# Patient Record
Sex: Male | Born: 1950 | Race: White | Hispanic: No | Marital: Married | Smoking: Never smoker
Health system: Southern US, Community
[De-identification: ages and names within clinical notes are randomized; demographics above are authoritative.]

## PROBLEM LIST (undated history)

## (undated) DIAGNOSIS — N200 Calculus of kidney: Secondary | ICD-10-CM

## (undated) DIAGNOSIS — K635 Polyp of colon: Secondary | ICD-10-CM

## (undated) DIAGNOSIS — K589 Irritable bowel syndrome without diarrhea: Secondary | ICD-10-CM

## (undated) HISTORY — DX: Calculus of kidney: N20.0

## (undated) HISTORY — PX: KNEE ARTHROSCOPY: SUR90

## (undated) HISTORY — DX: Irritable bowel syndrome, unspecified: K58.9

## (undated) HISTORY — DX: Polyp of colon: K63.5

---

## 1999-04-05 ENCOUNTER — Emergency Department (HOSPITAL_COMMUNITY): Admission: EM | Admit: 1999-04-05 | Discharge: 1999-04-05 | Payer: Self-pay | Admitting: Emergency Medicine

## 1999-04-05 ENCOUNTER — Encounter: Payer: Self-pay | Admitting: Emergency Medicine

## 2004-11-12 ENCOUNTER — Ambulatory Visit: Payer: Self-pay | Admitting: Internal Medicine

## 2004-11-23 ENCOUNTER — Ambulatory Visit: Payer: Self-pay | Admitting: Internal Medicine

## 2004-11-24 ENCOUNTER — Encounter: Admission: RE | Admit: 2004-11-24 | Discharge: 2004-11-24 | Payer: Self-pay | Admitting: Internal Medicine

## 2005-01-05 ENCOUNTER — Ambulatory Visit: Payer: Self-pay | Admitting: Gastroenterology

## 2005-01-19 ENCOUNTER — Encounter: Payer: Self-pay | Admitting: Internal Medicine

## 2005-01-19 ENCOUNTER — Ambulatory Visit: Payer: Self-pay | Admitting: Gastroenterology

## 2005-10-31 ENCOUNTER — Ambulatory Visit: Payer: Self-pay | Admitting: Internal Medicine

## 2005-12-01 ENCOUNTER — Ambulatory Visit: Payer: Self-pay | Admitting: Internal Medicine

## 2006-01-15 ENCOUNTER — Emergency Department: Payer: Self-pay | Admitting: Emergency Medicine

## 2006-05-21 ENCOUNTER — Emergency Department: Payer: Self-pay | Admitting: Emergency Medicine

## 2006-07-28 ENCOUNTER — Ambulatory Visit: Payer: Self-pay | Admitting: Internal Medicine

## 2007-05-31 DIAGNOSIS — Z860100 Personal history of colon polyps, unspecified: Secondary | ICD-10-CM | POA: Insufficient documentation

## 2007-05-31 DIAGNOSIS — Z87442 Personal history of urinary calculi: Secondary | ICD-10-CM | POA: Insufficient documentation

## 2007-06-22 ENCOUNTER — Ambulatory Visit: Payer: Self-pay | Admitting: Internal Medicine

## 2007-06-22 LAB — CONVERTED CEMR LAB
ALT: 18 units/L (ref 0–53)
AST: 27 units/L (ref 0–37)
Albumin: 4 g/dL (ref 3.5–5.2)
Alkaline Phosphatase: 61 units/L (ref 39–117)
BUN: 19 mg/dL (ref 6–23)
Basophils Absolute: 0 10*3/uL (ref 0.0–0.1)
Basophils Relative: 0.3 % (ref 0.0–1.0)
Bilirubin, Direct: 0.1 mg/dL (ref 0.0–0.3)
Blood in Urine, dipstick: NEGATIVE
CO2: 32 meq/L (ref 19–32)
Calcium: 9.6 mg/dL (ref 8.4–10.5)
Chloride: 111 meq/L (ref 96–112)
Cholesterol: 197 mg/dL (ref 0–200)
Creatinine, Ser: 1.2 mg/dL (ref 0.4–1.5)
Eosinophils Absolute: 0.2 10*3/uL (ref 0.0–0.6)
Eosinophils Relative: 5 % (ref 0.0–5.0)
GFR calc Af Amer: 80 mL/min
GFR calc non Af Amer: 66 mL/min
Glucose, Bld: 98 mg/dL (ref 70–99)
Glucose, Urine, Semiquant: NEGATIVE
HCT: 47.2 % (ref 39.0–52.0)
HDL: 49.8 mg/dL (ref >39.0)
Hemoglobin: 15.6 g/dL (ref 13.0–17.0)
Ketones, urine, test strip: NEGATIVE
LDL Cholesterol: 125 mg/dL — ABNORMAL HIGH (ref 0–99)
Lymphocytes Relative: 38.8 % (ref 12.0–46.0)
MCHC: 33.1 g/dL (ref 30.0–36.0)
MCV: 91.8 fL (ref 78.0–100.0)
Monocytes Absolute: 0.6 10*3/uL (ref 0.2–0.7)
Monocytes Relative: 11.1 % — ABNORMAL HIGH (ref 3.0–11.0)
Neutro Abs: 2.3 10*3/uL (ref 1.4–7.7)
Neutrophils Relative %: 44.8 % (ref 43.0–77.0)
Nitrite: NEGATIVE
PSA: 0.76 ng/mL (ref 0.10–4.00)
Platelets: 217 10*3/uL (ref 150–400)
Potassium: 4.4 meq/L (ref 3.5–5.1)
Protein, U semiquant: NEGATIVE
RBC: 5.14 M/uL (ref 4.22–5.81)
RDW: 12.4 % (ref 11.5–14.6)
Sodium: 150 meq/L — ABNORMAL HIGH (ref 135–145)
Specific Gravity, Urine: >=1.03
TSH: 1.18 microintl units/mL (ref 0.35–5.50)
Total Bilirubin: 1 mg/dL (ref 0.3–1.2)
Total CHOL/HDL Ratio: 4
Total Protein: 7 g/dL (ref 6.0–8.3)
Triglycerides: 111 mg/dL (ref 0–149)
Urobilinogen, UA: 1
VLDL: 22 mg/dL (ref 0–40)
WBC Urine, dipstick: NEGATIVE
WBC: 5 10*3/uL (ref 4.5–10.5)
pH: 5

## 2007-06-29 ENCOUNTER — Ambulatory Visit: Payer: Self-pay | Admitting: Internal Medicine

## 2007-08-01 ENCOUNTER — Ambulatory Visit: Payer: Self-pay | Admitting: Internal Medicine

## 2007-08-01 LAB — CONVERTED CEMR LAB
BUN: 21 mg/dL (ref 6–23)
CO2: 30 meq/L (ref 19–32)
Calcium: 9.8 mg/dL (ref 8.4–10.5)
Chloride: 105 meq/L (ref 96–112)
Creatinine, Ser: 1.1 mg/dL (ref 0.4–1.5)
GFR calc Af Amer: 89 mL/min
GFR calc non Af Amer: 73 mL/min
Glucose, Bld: 88 mg/dL (ref 70–99)
Potassium: 4.7 meq/L (ref 3.5–5.1)
Sodium: 142 meq/L (ref 135–145)

## 2007-08-08 ENCOUNTER — Ambulatory Visit: Payer: Self-pay | Admitting: Internal Medicine

## 2009-04-09 ENCOUNTER — Ambulatory Visit (HOSPITAL_BASED_OUTPATIENT_CLINIC_OR_DEPARTMENT_OTHER): Admission: RE | Admit: 2009-04-09 | Discharge: 2009-04-09 | Payer: Self-pay | Admitting: Orthopedic Surgery

## 2009-12-17 ENCOUNTER — Encounter (INDEPENDENT_AMBULATORY_CARE_PROVIDER_SITE_OTHER): Payer: Self-pay | Admitting: *Deleted

## 2010-04-23 ENCOUNTER — Ambulatory Visit: Payer: Self-pay | Admitting: Internal Medicine

## 2010-04-23 LAB — CONVERTED CEMR LAB
ALT: 13 units/L (ref 0–53)
AST: 21 units/L (ref 0–37)
Albumin: 4 g/dL (ref 3.5–5.2)
Alkaline Phosphatase: 54 units/L (ref 39–117)
BUN: 23 mg/dL (ref 6–23)
Basophils Absolute: 0 10*3/uL (ref 0.0–0.1)
Basophils Relative: 0.7 % (ref 0.0–3.0)
Bilirubin Urine: NEGATIVE
Bilirubin, Direct: 0.1 mg/dL (ref 0.0–0.3)
CO2: 31 meq/L (ref 19–32)
Calcium: 9.4 mg/dL (ref 8.4–10.5)
Chloride: 112 meq/L (ref 96–112)
Cholesterol: 195 mg/dL (ref 0–200)
Creatinine, Ser: 1.1 mg/dL (ref 0.4–1.5)
Eosinophils Absolute: 0.2 10*3/uL (ref 0.0–0.7)
Eosinophils Relative: 4.1 % (ref 0.0–5.0)
GFR calc non Af Amer: 72.41 mL/min (ref >60)
Glucose, Bld: 92 mg/dL (ref 70–99)
HCT: 44.9 % (ref 39.0–52.0)
HDL: 52.4 mg/dL (ref >39.00)
Hemoglobin, Urine: NEGATIVE
Hemoglobin: 15.3 g/dL (ref 13.0–17.0)
Ketones, ur: NEGATIVE mg/dL
LDL Cholesterol: 117 mg/dL — ABNORMAL HIGH (ref 0–99)
Leukocytes, UA: NEGATIVE
Lymphocytes Relative: 35.9 % (ref 12.0–46.0)
Lymphs Abs: 2 10*3/uL (ref 0.7–4.0)
MCHC: 34 g/dL (ref 30.0–36.0)
MCV: 91.8 fL (ref 78.0–100.0)
Monocytes Absolute: 0.7 10*3/uL (ref 0.1–1.0)
Monocytes Relative: 12.3 % — ABNORMAL HIGH (ref 3.0–12.0)
Neutro Abs: 2.6 10*3/uL (ref 1.4–7.7)
Neutrophils Relative %: 47 % (ref 43.0–77.0)
Nitrite: NEGATIVE
PSA: 0.6 ng/mL (ref 0.10–4.00)
Platelets: 192 10*3/uL (ref 150.0–400.0)
Potassium: 4.4 meq/L (ref 3.5–5.1)
RBC: 4.89 M/uL (ref 4.22–5.81)
RDW: 13.6 % (ref 11.5–14.6)
Sodium: 144 meq/L (ref 135–145)
Specific Gravity, Urine: >=1.03 (ref 1.000–1.030)
TSH: 1.1 microintl units/mL (ref 0.35–5.50)
Total Bilirubin: 0.8 mg/dL (ref 0.3–1.2)
Total CHOL/HDL Ratio: 4
Total Protein, Urine: NEGATIVE mg/dL
Total Protein: 6.6 g/dL (ref 6.0–8.3)
Triglycerides: 128 mg/dL (ref 0.0–149.0)
Urine Glucose: NEGATIVE mg/dL
Urobilinogen, UA: 1 (ref 0.0–1.0)
VLDL: 25.6 mg/dL (ref 0.0–40.0)
WBC: 5.6 10*3/uL (ref 4.5–10.5)
pH: 5.5 (ref 5.0–8.0)

## 2010-04-30 ENCOUNTER — Ambulatory Visit: Payer: Self-pay | Admitting: Internal Medicine

## 2010-05-04 ENCOUNTER — Ambulatory Visit: Payer: Self-pay | Admitting: Internal Medicine

## 2010-08-31 ENCOUNTER — Ambulatory Visit: Payer: Self-pay | Admitting: Internal Medicine

## 2010-08-31 DIAGNOSIS — N62 Hypertrophy of breast: Secondary | ICD-10-CM

## 2010-09-15 ENCOUNTER — Encounter
Admission: RE | Admit: 2010-09-15 | Discharge: 2010-09-15 | Payer: Self-pay | Source: Home / Self Care | Admitting: Internal Medicine

## 2010-09-15 LAB — HM MAMMOGRAPHY

## 2010-09-20 ENCOUNTER — Encounter: Payer: Self-pay | Admitting: Internal Medicine

## 2010-11-09 NOTE — Assessment & Plan Note (Signed)
Summary: 4 MTH ROV // RS/pts wife rsc/cjr--- PTS WIFE RSC // RS   Vital Signs:  Patient profile:   60 year old male Weight:      199 pounds Temp:     98.0 degrees F oral Pulse rate:   68 / minute Pulse rhythm:   regular BP sitting:   132 / 84  (left arm) Cuff size:   regular  Vitals Entered By: Alfred Levins, CMA (August 31, 2010 9:53 AM) CC: lt breast is larger than right, lt elbow pain, bumps on both legs   CC:  lt breast is larger than right, lt elbow pain, and bumps on both legs.  History of Present Illness: six-week history of unilateral breast enlargement. He describes painless left breast enlargement. No palpable mass. No unusual medications. No other exacerbating or relieving factors.  Patient denies any chest pain, shortness breath, PND.  Current Medications (verified): 1)  Qc Ibuprofen Ib 200 Mg  Tabs (Ibuprofen) .... Prn 2)  Triamcinolone Acetonide 0.1 % Crea (Triamcinolone Acetonide) .... Apply Bid To Affected Area  Allergies (verified): No Known Drug Allergies  Physical Exam  General:  well-developed well-nourished male in no acute distress. HEENT exam atraumatic, normocephalic. Neck supple. Chest clear auscultation. Patient does have significantly larger left breast than right. No masses identified. No axillary adenopathy. Abdominal exam active bowel sounds, soft.   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 Needs further evaluation. We'll check mammogram. I'll call patient after get the results. Orders: Radiology Referral (Radiology)  Complete Medication List: 1)  Qc Ibuprofen Ib 200 Mg Tabs (Ibuprofen) .... Prn 2)  Triamcinolone Acetonide 0.1 % Crea (Triamcinolone acetonide) .... Apply bid to affected area   Orders Added: 1)  Radiology Referral [Radiology] 2)  Est. Patient Level III [88416]

## 2010-11-09 NOTE — Letter (Signed)
Summary: Colonoscopy Date Change Letter  [facility]  [address] [place], [place] [address]   Phone: [phone]  Fax: [phone]      December 17, 2009 MRN: [id number]   Jeremy Kerr [address] [place], [place]  [address]   Dear [name],   Previously you were recommended to have a repeat colonoscopy around this time. Your chart was recently reviewed by Dr. Jarold Motto of [facility]. Follow up colonoscopy is now recommended in April 2016. This revised recommendation is based on current, nationally recognized guidelines for colorectal cancer screening and polyp surveillance. These guidelines are endorsed by the American Cancer Society, [employer] on Colorectal Cancer as well as numerous other major medical organizations.  Please understand that our recommendation assumes that you do not have any new symptoms such as bleeding, a change in bowel habits, anemia, or significant abdominal discomfort. If you do have any concerning GI symptoms or want to discuss the guideline recommendations, please call to arrange an office visit at your earliest convenience. Otherwise we will keep you in our reminder system and contact you 1-2 months prior to the date listed above to schedule your next colonoscopy.  Thank you,   Vania Rea. Jarold Motto, M.D.  [facility] [phone]

## 2010-11-09 NOTE — Assessment & Plan Note (Signed)
Summary: cpx//ccm   Vital Signs:  Patient profile:   60 year old male Height:      72 inches Weight:      199 pounds BMI:     27.09 Pulse rate:   62 / minute Pulse rhythm:   regular Resp:     12 per minute BP sitting:   120 / 76  (left arm) Cuff size:   regular  Vitals Entered By: Gladis Riffle, RN (April 30, 2010 9:11 AM) CC: cpx, labs done--thinks colonoscopy due this year--c/o left knee pain, states ACL tear was not repaired Is Patient Diabetic? No   CC:  cpx, labs done--thinks colonoscopy due this year--c/o left knee pain, and states ACL tear was not repaired.  History of Present Illness: CPX  Preventive Screening-Counseling & Management  Alcohol-Tobacco     Smoking Status: never  Current Problems (verified): 1)  Physical Examination  (ICD-V70.0) 2)  Nephrolithiasis, Hx of  (ICD-V13.01) 3)  Colonic Polyps, Hx of  (ICD-V12.72)  Current Medications (verified): 1)  Qc Ibuprofen Ib 200 Mg  Tabs (Ibuprofen) .... 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   Head:  normocephalic and atraumatic.   Eyes:  pupils equal and pupils round.   Ears:  R ear normal and L ear normal.   Nose:  no external deformity and no external erythema.   Neck:  No deformities, masses, or tenderness noted. Chest Wall:  No deformities, masses, tenderness or gynecomastia  noted. Lungs:  normal respiratory effort and no intercostal retractions.   Heart:  normal rate and regular rhythm.   Abdomen:  Bowel sounds positive,abdomen soft and non-tender without masses, organomegaly or hernias noted. Prostate:  no nodules and no asymmetry.   Msk:  No deformity or scoliosis noted of thoracic or lumbar spine.   Pulses:  R and L carotid,radial,femoral,dorsalis pedis and posterior tibial pulses are full and equal bilaterally Neurologic:  cranial nerves II-XII intact and gait normal.     Impression & Recommendations:  Problem # 1:  PHYSICAL EXAMINATION (ICD-V70.0) health maint UTD  reviewed colonoscopy letter  Problem # 2:  KNEE PAIN (ZOX-096.04) Assessment: New left knee pain gradually worsening throbbing discomfort exam is unremarkable will check xray His updated medication list for this problem includes:    Qc Ibuprofen Ib 200 Mg Tabs (Ibuprofen) .Marland Kitchen... Prn  Complete Medication List: 1)  Qc Ibuprofen Ib 200 Mg Tabs (Ibuprofen) .... Prn  Appended Document: Orders Update     Clinical Lists Changes  Orders: Added new Test order of T-Knee Left 2 view ([id number]) - Signed

## 2010-11-11 NOTE — Letter (Signed)
Summary: Generic Letter  [facility]  [address] [place], [place] [address]   Phone: [phone]  Fax: [phone]    09/20/2010  Jeremy Kerr [address] [place], [place]  [address]  Dear Jeremy Kerr,  I have been trying to reach you.  Please call me at your earliest convenience to discuss some test results and update your phone number when you call.  [phone] ext [phone]    Sincerely,  Alfred Levins, CMA

## 2011-01-16 LAB — POCT HEMOGLOBIN-HEMACUE: Hemoglobin: 15.5 g/dL (ref 13.0–17.0)

## 2011-02-22 NOTE — Op Note (Signed)
Jeremy Kerr, STARACE NO.:  [id number]   MEDICAL RECORD NO.:  [id number]          PATIENT TYPE:  AMB   LOCATION:  DSC                          FACILITY:  [facility]   PHYSICIAN:  Loreta Ave, M.D. DATE OF BIRTH:  1951-09-05   DATE OF PROCEDURE:  04/09/2009  DATE OF DISCHARGE:                               OPERATIVE REPORT   PREOPERATIVE DIAGNOSES:  Left knee medial meniscus tear, anterior  cruciate ligament tear.   POSTOPERATIVE DIAGNOSES:  Left knee medial meniscus tear, anterior  cruciate ligament tear with some focal grade 3 changes, lateral patella.   PROCEDURES:  Left knee exam under anesthesia; arthroscopy; debridement  of medial meniscus; chondroplasty, patella; and debridement of anterior  cruciate ligament with removal of bone and loose body of near proximal  aspect.   SURGEON:  Loreta Ave, MD   ASSISTANT:  Genene Churn. Barry Dienes, PA   ANESTHESIA:  General.   ESTIMATED BLOOD LOSS:  Minimal.   SPECIMEN:  None.   CULTURES:  None.   COMPLICATIONS:  None.   DRESSING:  Sterile compressive.   PROCEDURE:  The patient brought to the operating room and placed on the  operating table in supine position.  After adequate anesthesia had been  obtained, knee examined.  Positive Lachman, positive drawer, positive  pivot shift.  Leg holder applied.  Leg prepped and draped in the usual  sterile fashion.  Three portals created, one superolateral, one each  medial and lateral parapatellar.  Inflow catheter introduced and the  standard arthroscope introduced and knee inspected.  Some focal grade 3  changes, lateral patella and debrided with chondroplasty.  Good  tracking.  Remaining articular cartilage throughout looked good.  Lateral meniscus, lateral compartment normal.  Medial meniscus what was  left to the posterior half had marked complex tearing with detachment of  the posterior horn.  Posterior half removed, tapered into remaining  meniscus.  As I  discussed with the patient preop and by his wishes, I  just debrided out the ACL tear which was completely avulsed proximal and  little bony fragment but then tearing throughout the ligament.  All  displaced tissue and loose body removed.  The entire knee examined and  no other findings appreciated.  Although he had significant instability,  he is going to try rehab before reconstruction.  Instruments and fluid removed.  Portals in knee were injected with  Marcaine.  Portals closed with 4-0 nylon.  Sterile compressive dressing  applied.  Anesthesia reversed.  Brought to the recovery room.  Tolerated  the surgery well.  No complications.      Loreta Ave, M.D.  Electronically Signed     DFM/MEDQ  D:  04/09/2009  T:  04/10/2009  Job:  [id number]

## 2011-05-09 ENCOUNTER — Encounter: Payer: Self-pay | Admitting: Internal Medicine

## 2011-05-10 ENCOUNTER — Ambulatory Visit (INDEPENDENT_AMBULATORY_CARE_PROVIDER_SITE_OTHER): Payer: BC Managed Care – PPO | Admitting: Internal Medicine

## 2011-05-10 ENCOUNTER — Encounter: Payer: Self-pay | Admitting: Internal Medicine

## 2011-05-10 VITALS — BP 130/84 | Temp 97.7°F | Ht 72.0 in | Wt 202.0 lb

## 2011-05-10 DIAGNOSIS — R519 Headache, unspecified: Secondary | ICD-10-CM | POA: Insufficient documentation

## 2011-05-10 DIAGNOSIS — Z0000 Encounter for general adult medical examination without abnormal findings: Secondary | ICD-10-CM

## 2011-05-10 DIAGNOSIS — R51 Headache: Secondary | ICD-10-CM

## 2011-05-10 LAB — LIPID PANEL
HDL: 55.9 mg/dL (ref >39.00)
Total CHOL/HDL Ratio: 4
Triglycerides: 143 mg/dL (ref 0.0–149.0)
VLDL: 28.6 mg/dL (ref 0.0–40.0)

## 2011-05-10 LAB — PSA: PSA: 0.65 ng/mL (ref 0.10–4.00)

## 2011-05-10 LAB — CBC WITH DIFFERENTIAL/PLATELET
Basophils Relative: 0.7 % (ref 0.0–3.0)
Eosinophils Relative: 7.1 % — ABNORMAL HIGH (ref 0.0–5.0)
Hemoglobin: 15 g/dL (ref 13.0–17.0)
Lymphocytes Relative: 31.5 % (ref 12.0–46.0)
Monocytes Relative: 12.4 % — ABNORMAL HIGH (ref 3.0–12.0)
Neutro Abs: 2.5 10*3/uL (ref 1.4–7.7)
Neutrophils Relative %: 48.3 % (ref 43.0–77.0)
RBC: 5.03 Mil/uL (ref 4.22–5.81)
WBC: 5.3 10*3/uL (ref 4.5–10.5)

## 2011-05-10 LAB — POCT URINALYSIS DIPSTICK
Bilirubin, UA: NEGATIVE
Blood, UA: NEGATIVE
Glucose, UA: NEGATIVE
Leukocytes, UA: NEGATIVE
Nitrite, UA: NEGATIVE
Urobilinogen, UA: 2

## 2011-05-10 LAB — BASIC METABOLIC PANEL
CO2: 29 mEq/L (ref 19–32)
Calcium: 9.6 mg/dL (ref 8.4–10.5)
Creatinine, Ser: 1.1 mg/dL (ref 0.4–1.5)
Sodium: 145 mEq/L (ref 135–145)

## 2011-05-10 LAB — HEPATIC FUNCTION PANEL
ALT: 18 U/L (ref 0–53)
Bilirubin, Direct: 0.2 mg/dL (ref 0.0–0.3)
Total Bilirubin: 0.7 mg/dL (ref 0.3–1.2)

## 2011-05-10 LAB — TSH: TSH: 1.59 u[IU]/mL (ref 0.35–5.50)

## 2011-05-10 MED ORDER — MELOXICAM 7.5 MG PO TABS: 7.5000 mg | ORAL_TABLET | Freq: Every day | ORAL | Status: DC

## 2011-05-10 NOTE — Progress Notes (Signed)
  Subjective:    Patient ID: Jeremy Kerr, male    DOB: 07/08/1951, 60 y.o.   MRN: [id number]  HPI New onset headache for 2 weeks Location: posterior neck, radiates to frontal area No neurologic sxs He has had similar headaches previously but only once / week No visual disturbance or other associated sxs  Past Medical History  Diagnosis Date  . Nephrolithiasis   . Colon polyps   . IBS (irritable bowel syndrome)    Past Surgical History  Procedure Date  . Knee arthroscopy     reports that he has never smoked. He does not have any smokeless tobacco history on file. He reports that he does not drink alcohol. His drug history not on file. family history includes Alcohol abuse in his father and COPD in his mother. No Known Allergies    Review of Systems  patient denies chest pain, shortness of breath, orthopnea. Denies lower extremity edema, abdominal pain, change in appetite, change in bowel movements. Patient denies rashes, musculoskeletal complaints. No other specific complaints in a complete review of systems.      Objective:   Physical Exam  well-developed well-nourished male in no acute distress. HEENT exam atraumatic, normocephalic, neck supple without jugular venous distention. Chest clear to auscultation cardiac exam S1-S2 are regular. Abdominal exam overweight with bowel sounds, soft and nontender. Extremities no edema. Neurologic exam is alert with a normal gait.        Assessment & Plan:   No problem-specific assessment & plan notes found for this encounter.

## 2011-05-10 NOTE — Patient Instructions (Signed)
Do not take motrin, advil, ibuprofen, naproxen shile on meloxicam

## 2011-05-10 NOTE — Assessment & Plan Note (Signed)
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

## 2011-05-10 NOTE — Progress Notes (Signed)
Addended by: Serena Colonel on: 05/10/2011 08:39 AM   Modules accepted: Orders

## 2011-08-02 ENCOUNTER — Other Ambulatory Visit: Payer: BC Managed Care – PPO

## 2011-08-09 ENCOUNTER — Encounter: Payer: Self-pay | Admitting: Internal Medicine

## 2011-08-09 ENCOUNTER — Ambulatory Visit (INDEPENDENT_AMBULATORY_CARE_PROVIDER_SITE_OTHER): Payer: BC Managed Care – PPO | Admitting: Internal Medicine

## 2011-08-09 VITALS — BP 124/86 | HR 72 | Temp 98.3°F | Ht 72.0 in | Wt 202.0 lb

## 2011-08-09 DIAGNOSIS — Z23 Encounter for immunization: Secondary | ICD-10-CM

## 2011-08-09 DIAGNOSIS — Z0000 Encounter for general adult medical examination without abnormal findings: Secondary | ICD-10-CM

## 2011-08-18 NOTE — Progress Notes (Signed)
  Subjective:    Patient ID: Jeremy Kerr, male    DOB: Jul 08, 1951, 60 y.o.   MRN: [id number]  HPI  cpx   Past Medical History  Diagnosis Date  . Nephrolithiasis   . Colon polyps   . IBS (irritable bowel syndrome)    Past Surgical History  Procedure Date  . Knee arthroscopy     reports that he has never smoked. He does not have any smokeless tobacco history on file. He reports that he does not drink alcohol. His drug history not on file. family history includes Alcohol abuse in his father and COPD in his mother. No Known Allergies   Review of Systems  patient denies chest pain, shortness of breath, orthopnea. Denies lower extremity edema, abdominal pain, change in appetite, change in bowel movements. Patient denies rashes, musculoskeletal complaints. No other specific complaints in a complete review of systems.      Objective:   Physical Exam Well-developed male in no acute distress. HEENT exam atraumatic, normocephalic, extraocular muscles are intact. Conjunctivae are pink without exudate. Neck is supple without lymphadenopathy, thyromegaly, jugular venous distention. Chest is clear to auscultation without increased work of breathing. Cardiac exam S1-S2 are regular. The PMI is normal. No significant murmurs or gallops. Abdominal exam active bowel sounds, soft, nontender. No abdominal bruits. Extremities no clubbing cyanosis or edema. Peripheral pulses are normal without bruits. Neurologic exam alert and oriented without any motor or sensory deficits. Rectal exam normal tone prostate normal size without masses or asymmetry.     Assessment & Plan:  Well visit:  Health maint UTD.

## 2012-02-09 ENCOUNTER — Ambulatory Visit (INDEPENDENT_AMBULATORY_CARE_PROVIDER_SITE_OTHER): Payer: BC Managed Care – PPO | Admitting: Internal Medicine

## 2012-02-09 ENCOUNTER — Encounter: Payer: Self-pay | Admitting: Internal Medicine

## 2012-02-09 VITALS — BP 120/84 | Temp 97.8°F

## 2012-02-09 DIAGNOSIS — Z86010 Personal history of colonic polyps: Secondary | ICD-10-CM

## 2012-02-09 DIAGNOSIS — R109 Unspecified abdominal pain: Secondary | ICD-10-CM

## 2012-02-09 DIAGNOSIS — Z87442 Personal history of urinary calculi: Secondary | ICD-10-CM

## 2012-02-09 NOTE — Progress Notes (Signed)
  Subjective:    Patient ID: Jeremy Kerr, male    DOB: Mar 03, 1951, 61 y.o.   MRN: [id number]  HPI  61 year old patient whose past medical history is remarkable for a history of colonic polyps as well as nephrolithiasis. The past several months he has noted left-sided abdominal pain for the past 4 weeks this has intensified. He states the pain is now daily and he is unable to lie on his left side without worsening the pain. He has had episodes of renal colic in the past and he feels this pain is qualitatively different. He has chronic constipation but no change in his bowel habits no weight loss nausea or vomiting. No other constitutional complaints. He does have a history of colonic polyps and is about 8 years out from his last colonoscopy. He    Wt Readings from Last 3 Encounters:  08/09/11 202 lb (91.627 kg)  05/10/11 202 lb (91.627 kg)  08/31/10 199 lb (90.266 kg)      Review of Systems  Constitutional: Negative for fever, chills, appetite change and fatigue.  HENT: Negative for hearing loss, ear pain, congestion, sore throat, trouble swallowing, neck stiffness, dental problem, voice change and tinnitus.   Eyes: Negative for pain, discharge and visual disturbance.  Respiratory: Negative for cough, chest tightness, wheezing and stridor.   Cardiovascular: Negative for chest pain, palpitations and leg swelling.  Gastrointestinal: Positive for abdominal pain. Negative for nausea, vomiting, diarrhea, constipation, blood in stool and abdominal distention.  Genitourinary: Negative for urgency, hematuria, flank pain, discharge, difficulty urinating and genital sores.  Musculoskeletal: Negative for myalgias, back pain, joint swelling, arthralgias and gait problem.  Skin: Negative for rash.  Neurological: Negative for dizziness, syncope, speech difficulty, weakness, numbness and headaches.  Hematological: Negative for adenopathy. Does not bruise/bleed easily.  Psychiatric/Behavioral: Negative for  behavioral problems and dysphoric mood. The patient is not nervous/anxious.        Objective:   Physical Exam  Constitutional: He is oriented to person, place, and time. He appears well-developed.  HENT:  Head: Normocephalic.  Right Ear: External ear normal.  Left Ear: External ear normal.  Eyes: Conjunctivae and EOM are normal.  Neck: Normal range of motion.  Cardiovascular: Normal rate and normal heart sounds.   Pulmonary/Chest: Breath sounds normal.  Abdominal: Soft. Bowel sounds are normal. He exhibits no mass. There is tenderness.       Mild tenderness in the left flank and left mid abdominal area. No mass effect noted no left lower quadrant tenderness no guarding or rebound. Bowel sounds normal  Musculoskeletal: Normal range of motion. He exhibits no edema and no tenderness.  Neurological: He is alert and oriented to person, place, and time.  Psychiatric: He has a normal mood and affect. His behavior is normal.          Assessment & Plan:   Chronic L sided  abdom pain- will proceed with abdominal CT and also set a followup colonoscopy. He will call it there is any clinical worsening

## 2012-02-09 NOTE — Patient Instructions (Signed)
Colonoscopy and CT abdominal scan as discussed  Call or return to clinic prn if these symptoms worsen or fail to improve as anticipated.

## 2012-02-13 ENCOUNTER — Ambulatory Visit (INDEPENDENT_AMBULATORY_CARE_PROVIDER_SITE_OTHER)
Admission: RE | Admit: 2012-02-13 | Discharge: 2012-02-13 | Disposition: A | Discharge status: Home / Self Care | Payer: BC Managed Care – PPO | Source: Ambulatory Visit | Attending: Internal Medicine | Admitting: Internal Medicine

## 2012-02-13 DIAGNOSIS — R109 Unspecified abdominal pain: Secondary | ICD-10-CM

## 2012-02-13 DIAGNOSIS — Z87442 Personal history of urinary calculi: Secondary | ICD-10-CM

## 2012-02-13 MED ORDER — IOHEXOL 300 MG/ML  SOLN
100.0000 mL | Freq: Once | INTRAMUSCULAR | Status: AC | PRN
  Administered 2012-02-13: 100 mL via INTRAVENOUS

## 2012-02-14 ENCOUNTER — Encounter: Payer: Self-pay | Admitting: Internal Medicine

## 2012-02-14 NOTE — Progress Notes (Signed)
Quick Note:  Unable to reach by phone , hm and wk ring busy x3 each. Cell # "wrong number" Letter sent - normal results ______

## 2012-02-20 ENCOUNTER — Telehealth: Payer: Self-pay | Admitting: Internal Medicine

## 2012-02-20 NOTE — Telephone Encounter (Signed)
Patient called wanting to know if his colonoscopy has been scheduled. Please advise. Please call patient on his home phone [phone]

## 2012-02-20 NOTE — Telephone Encounter (Signed)
Terri - I see order? Under "procedure Tab" - is this correct? Please advise if additional order needs to be done

## 2012-02-21 ENCOUNTER — Telehealth: Payer: Self-pay

## 2012-02-21 NOTE — Telephone Encounter (Signed)
I do not have a referral for this pt.  However, I saw that he is not due for a Screening Colonoscopy until 2016.  [facility] will contact him directly when that time approaches.

## 2012-02-21 NOTE — Telephone Encounter (Signed)
New order done r/t abd pain - and referral intended from 02/09/12 appt.

## 2012-02-22 ENCOUNTER — Telehealth: Payer: Self-pay | Admitting: Gastroenterology

## 2012-02-22 NOTE — Telephone Encounter (Signed)
lmom for pt to call back. Pt has seen PCP for l sided pain and had a neg CT scan. Pt had COLON 01/19/2005 that had a poor prep.

## 2012-02-22 NOTE — Telephone Encounter (Signed)
Direct is ok 

## 2012-02-22 NOTE — Telephone Encounter (Signed)
Pt c/o l side abdominal pain that has gotten worse. Pt saw Dr Amador Cunas who ordered an CT scan that was normal. Pt's last COLON was 01/19/2005 with a poor prep an unable to see polyps, if any; I have sent for the chart. Recall in for 2016. Can we schedule for a DIRECT COLON or see PA 1st? Thanks.

## 2012-02-22 NOTE — Telephone Encounter (Signed)
Pt had stated it was ok to leave a message machine or with his wife.  Informed wife and scheduled appt with her for PV and direct COLON.

## 2012-02-27 ENCOUNTER — Encounter: Payer: Self-pay | Admitting: Gastroenterology

## 2012-02-27 ENCOUNTER — Ambulatory Visit (AMBULATORY_SURGERY_CENTER): Payer: BC Managed Care – PPO | Admitting: *Deleted

## 2012-02-27 VITALS — Ht 72.0 in | Wt 204.2 lb

## 2012-02-27 DIAGNOSIS — R109 Unspecified abdominal pain: Secondary | ICD-10-CM

## 2012-02-27 MED ORDER — PEG-KCL-NACL-NASULF-NA ASC-C 100 G PO SOLR: ORAL | Status: DC

## 2012-02-27 NOTE — Progress Notes (Signed)
Pt says he has problems with constipation.  He used Golytly for prep in 2006 and was not cleaned out adequately.  Instructions given for 2 day prep. Ezra Sites

## 2012-02-29 ENCOUNTER — Ambulatory Visit (AMBULATORY_SURGERY_CENTER): Payer: BC Managed Care – PPO | Admitting: Gastroenterology

## 2012-02-29 ENCOUNTER — Other Ambulatory Visit: Payer: Self-pay | Admitting: Gastroenterology

## 2012-02-29 ENCOUNTER — Encounter: Payer: Self-pay | Admitting: Gastroenterology

## 2012-02-29 VITALS — BP 117/83 | HR 55 | Temp 96.5°F | Resp 20 | Ht 72.0 in | Wt 204.0 lb

## 2012-02-29 DIAGNOSIS — R109 Unspecified abdominal pain: Secondary | ICD-10-CM

## 2012-02-29 DIAGNOSIS — K5730 Diverticulosis of large intestine without perforation or abscess without bleeding: Secondary | ICD-10-CM

## 2012-02-29 DIAGNOSIS — Z86010 Personal history of colonic polyps: Secondary | ICD-10-CM

## 2012-02-29 MED ORDER — RABEPRAZOLE SODIUM 20 MG PO TBEC: 20.0000 mg | DELAYED_RELEASE_TABLET | Freq: Every day | ORAL | Status: AC

## 2012-02-29 MED ORDER — SODIUM CHLORIDE 0.9 % IV SOLN: 500.0000 mL | INTRAVENOUS | Status: DC

## 2012-02-29 NOTE — Op Note (Signed)
[facility] [address] [name], [place]  [address]  COLONOSCOPY PROCEDURE REPORT  PATIENT:  Jeremy, Kerr  MR#:  [id number] BIRTHDATE:  1951/03/29, 60 yrs. old  GENDER:  male ENDOSCOPIST:  Vania Rea. Jarold Motto, MD, [facility] REF. BY:  Birdie Sons, M.D. PROCEDURE DATE:  02/29/2012 PROCEDURE:  Average-risk screening colonoscopy G0121 ASA CLASS:  Class II INDICATIONS:  Abdominal pain, change in bowel habits MEDICATIONS:   propofol (Diprivan) 250 mg IV  DESCRIPTION OF PROCEDURE:   After the risks and benefits and of the procedure were explained, informed consent was obtained. Digital rectal exam was performed and revealed no abnormalities. The [facility] [id number] endoscope was introduced through the anus and advanced to the cecum, which was identified by both the appendix and ileocecal valve.  The quality of the prep was adequate, using MoviPrep.  The instrument was then slowly withdrawn as the colon was fully examined. <<PROCEDUREIMAGES>>  FINDINGS:  Moderate diverticulosis was found in the sigmoid to descending colon segments.  No polyps or cancers were seen.  This was otherwise a normal examination of the colon.   Retroflexed views in the rectum revealed no abnormalities.    The scope was then withdrawn from the patient and the procedure completed.  COMPLICATIONS:  None ENDOSCOPIC IMPRESSION: 1) Moderate diverticulosis in the sigmoid to descending colon segments 2) No polyps or cancers 3) Otherwise normal examination RECOMMENDATIONS: 1) High fiber diet with liberal fluid intake. 2) Repeat Colonscopy in 10 years. 3) metamucil or benefiber  REPEAT EXAM:  No  ______________________________ Vania Rea. Jarold Motto, MD, [name]  CC:  n. eSIGNED:   Vania Rea. Birdena Kingma at 02/29/2012 11:41 AM  Alexia Freestone, [phone]

## 2012-02-29 NOTE — Progress Notes (Signed)
Propofol given and oxygen managed per D Merritt CRNA 

## 2012-02-29 NOTE — Patient Instructions (Signed)
Impressions/recommendations:  Diverticulosis (handout given) High Fiber Diet  (handout given)  Metamucil or Benefiber  Repeat colonoscopy in 10 years.  YOU HAD AN ENDOSCOPIC PROCEDURE TODAY AT THE [facility]: Refer to the procedure report that was given to you for any specific questions about what was found during the examination.  If the procedure report does not answer your questions, please call your gastroenterologist to clarify.  If you requested that your care partner not be given the details of your procedure findings, then the procedure report has been included in a sealed envelope for you to review at your convenience later.  YOU SHOULD EXPECT: Some feelings of bloating in the abdomen. Passage of more gas than usual.  Walking can help get rid of the air that was put into your GI tract during the procedure and reduce the bloating. If you had a lower endoscopy (such as a colonoscopy or flexible sigmoidoscopy) you may notice spotting of blood in your stool or on the toilet paper. If you underwent a bowel prep for your procedure, then you may not have a normal bowel movement for a few days.  DIET: Your first meal following the procedure should be a light meal and then it is ok to progress to your normal diet.  A half-sandwich or bowl of soup is an example of a good first meal.  Heavy or fried foods are harder to digest and may make you feel nauseous or bloated.  Likewise meals heavy in dairy and vegetables can cause extra gas to form and this can also increase the bloating.  Drink plenty of fluids but you should avoid alcoholic beverages for 24 hours.  ACTIVITY: Your care partner should take you home directly after the procedure.  You should plan to take it easy, moving slowly for the rest of the day.  You can resume normal activity the day after the procedure however you should NOT DRIVE or use heavy machinery for 24 hours (because of the sedation medicines used during the test).      SYMPTOMS TO REPORT IMMEDIATELY: A gastroenterologist can be reached at any hour.  During normal business hours, 8:30 AM to 5:00 PM Monday through Friday, call [phone].  After hours and on weekends, please call the GI answering service at [phone] who will take a message and have the physician on call contact you.   Following lower endoscopy (colonoscopy or flexible sigmoidoscopy):  Excessive amounts of blood in the stool  Significant tenderness or worsening of abdominal pains  Swelling of the abdomen that is new, acute  Fever of 100F or higher  Following upper endoscopy (EGD)  Vomiting of blood or coffee ground material  New chest pain or pain under the shoulder blades  Painful or persistently difficult swallowing  New shortness of breath  Fever of 100F or higher  Black, tarry-looking stools  FOLLOW UP: If any biopsies were taken you will be contacted by phone or by letter within the next 1-3 weeks.  Call your gastroenterologist if you have not heard about the biopsies in 3 weeks.  Our staff will call the home number listed on your records the next business day following your procedure to check on you and address any questions or concerns that you may have at that time regarding the information given to you following your procedure. This is a courtesy call and so if there is no answer at the home number and we have not heard from you through the emergency  physician on call, we will assume that you have returned to your regular daily activities without incident.  SIGNATURES/CONFIDENTIALITY: You and/or your care partner have signed paperwork which will be entered into your electronic medical record.  These signatures attest to the fact that that the information above on your After Visit Summary has been reviewed and is understood.  Full responsibility of the confidentiality of this discharge information lies with you and/or your care-partner.

## 2012-02-29 NOTE — Progress Notes (Signed)
Patient did not experience any of the following events: a burn prior to discharge; a fall within the facility; wrong site/side/patient/procedure/implant event; or a hospital transfer or hospital admission upon discharge from the facility. (G8907) Patient did not have preoperative order for IV antibiotic SSI prophylaxis. (G8918)  

## 2012-03-01 ENCOUNTER — Telehealth: Payer: Self-pay

## 2012-03-01 NOTE — Telephone Encounter (Signed)
Left message on answering machine. 

## 2013-10-05 ENCOUNTER — Emergency Department: Payer: Self-pay | Admitting: Emergency Medicine

## 2013-10-05 LAB — BASIC METABOLIC PANEL
BUN: 22 mg/dL — ABNORMAL HIGH (ref 7–18)
Co2: 30 mmol/L (ref 21–32)
Sodium: 141 mmol/L (ref 136–145)

## 2013-10-05 LAB — CBC
HCT: 46.9 % (ref 40.0–52.0)
HGB: 15.6 g/dL (ref 13.0–18.0)
MCH: 30.4 pg (ref 26.0–34.0)
MCV: 91 fL (ref 80–100)
RBC: 5.14 10*6/uL (ref 4.40–5.90)

## 2013-10-06 LAB — URINALYSIS, COMPLETE
Bacteria: NONE SEEN
Nitrite: NEGATIVE
Specific Gravity: 1.02 (ref 1.003–1.030)
Squamous Epithelial: NONE SEEN

## 2014-12-25 ENCOUNTER — Encounter: Payer: Self-pay | Admitting: Internal Medicine

## 2016-02-05 ENCOUNTER — Encounter: Payer: Self-pay | Admitting: Gastroenterology

## 2019-05-09 ENCOUNTER — Emergency Department: Payer: No Typology Code available for payment source

## 2019-05-09 ENCOUNTER — Encounter: Payer: Self-pay | Admitting: Emergency Medicine

## 2019-05-09 ENCOUNTER — Other Ambulatory Visit: Payer: Self-pay

## 2019-05-09 ENCOUNTER — Emergency Department
Admission: EM | Admit: 2019-05-09 | Discharge: 2019-05-09 | Disposition: A | Discharge status: Home / Self Care | Payer: No Typology Code available for payment source | Attending: Emergency Medicine | Admitting: Emergency Medicine

## 2019-05-09 DIAGNOSIS — B028 Zoster with other complications: Secondary | ICD-10-CM | POA: Diagnosis not present

## 2019-05-09 DIAGNOSIS — Z79899 Other long term (current) drug therapy: Secondary | ICD-10-CM | POA: Insufficient documentation

## 2019-05-09 DIAGNOSIS — L03211 Cellulitis of face: Secondary | ICD-10-CM | POA: Diagnosis not present

## 2019-05-09 DIAGNOSIS — R21 Rash and other nonspecific skin eruption: Secondary | ICD-10-CM | POA: Diagnosis present

## 2019-05-09 LAB — CBC WITH DIFFERENTIAL/PLATELET
Abs Immature Granulocytes: 0.01 10*3/uL (ref 0.00–0.07)
Basophils Absolute: 0 10*3/uL (ref 0.0–0.1)
Basophils Relative: 1 %
Eosinophils Absolute: 0.1 10*3/uL (ref 0.0–0.5)
Eosinophils Relative: 1 %
HCT: 45.9 % (ref 39.0–52.0)
Hemoglobin: 15 g/dL (ref 13.0–17.0)
Immature Granulocytes: 0 %
Lymphocytes Relative: 31 %
Lymphs Abs: 1.4 10*3/uL (ref 0.7–4.0)
MCH: 30.1 pg (ref 26.0–34.0)
MCHC: 32.7 g/dL (ref 30.0–36.0)
MCV: 92.2 fL (ref 80.0–100.0)
Monocytes Absolute: 0.8 10*3/uL (ref 0.1–1.0)
Monocytes Relative: 18 %
Neutro Abs: 2.3 10*3/uL (ref 1.7–7.7)
Neutrophils Relative %: 49 %
Platelets: 163 10*3/uL (ref 150–400)
RBC: 4.98 MIL/uL (ref 4.22–5.81)
RDW: 12.8 % (ref 11.5–15.5)
WBC: 4.6 10*3/uL (ref 4.0–10.5)
nRBC: 0 % (ref 0.0–0.2)

## 2019-05-09 LAB — COMPREHENSIVE METABOLIC PANEL
ALT: 25 U/L (ref 0–44)
AST: 39 U/L (ref 15–41)
Albumin: 4 g/dL (ref 3.5–5.0)
Alkaline Phosphatase: 65 U/L (ref 38–126)
Anion gap: 8 (ref 5–15)
BUN: 14 mg/dL (ref 8–23)
CO2: 26 mmol/L (ref 22–32)
Calcium: 9 mg/dL (ref 8.9–10.3)
Chloride: 105 mmol/L (ref 98–111)
Creatinine, Ser: 1.2 mg/dL (ref 0.61–1.24)
GFR calc Af Amer: >60 mL/min (ref >60)
GFR calc non Af Amer: >60 mL/min (ref >60)
Glucose, Bld: 86 mg/dL (ref 70–99)
Potassium: 3.7 mmol/L (ref 3.5–5.1)
Sodium: 139 mmol/L (ref 135–145)
Total Bilirubin: 0.9 mg/dL (ref 0.3–1.2)
Total Protein: 6.9 g/dL (ref 6.5–8.1)

## 2019-05-09 MED ORDER — PREDNISONE 20 MG PO TABS: 60.0000 mg | ORAL_TABLET | Freq: Every day | ORAL | 0 refills | Status: AC

## 2019-05-09 MED ORDER — CEPHALEXIN 500 MG PO CAPS: 500.0000 mg | ORAL_CAPSULE | Freq: Three times a day (TID) | ORAL | 0 refills | Status: AC

## 2019-05-09 MED ORDER — VALACYCLOVIR HCL 1 G PO TABS: 1000.0000 mg | ORAL_TABLET | Freq: Three times a day (TID) | ORAL | 0 refills | Status: AC

## 2019-05-09 MED ORDER — METHYLPREDNISOLONE SODIUM SUCC 125 MG IJ SOLR
125.0000 mg | Freq: Once | INTRAMUSCULAR | Status: AC
  Administered 2019-05-09: 125 mg via INTRAVENOUS
  Filled 2019-05-09: qty 2

## 2019-05-09 MED ORDER — FLUORESCEIN SODIUM 1 MG OP STRP
1.0000 | ORAL_STRIP | Freq: Once | OPHTHALMIC | Status: AC
  Administered 2019-05-09: 1 via OPHTHALMIC
  Filled 2019-05-09: qty 1

## 2019-05-09 MED ORDER — IOHEXOL 300 MG/ML  SOLN
75.0000 mL | Freq: Once | INTRAMUSCULAR | Status: AC | PRN
  Administered 2019-05-09: 06:00:00 75 mL via INTRAVENOUS

## 2019-05-09 MED ORDER — SODIUM CHLORIDE 0.9 % IV SOLN
1.0000 g | Freq: Once | INTRAVENOUS | Status: AC
  Administered 2019-05-09: 06:00:00 1 g via INTRAVENOUS
  Filled 2019-05-09: qty 10

## 2019-05-09 MED ORDER — DEXTROSE 5 % IV SOLN
10.0000 mg/kg | Freq: Once | INTRAVENOUS | Status: AC
  Administered 2019-05-09: 775 mg via INTRAVENOUS
  Filled 2019-05-09: qty 15.5

## 2019-05-09 NOTE — ED Triage Notes (Signed)
Patient ambulatory to triage with steady gait, without difficulty or distress noted, mask in place; pt reports dx with shingles at [facility] (left side scalp/eye); currently taking valtex since Monday;

## 2019-05-09 NOTE — ED Notes (Signed)
AAOx3.  Skin warm and dry.  NAD 

## 2019-05-09 NOTE — ED Provider Notes (Addendum)
[facility] Emergency Department Provider Note  ____________________________________________  Time seen: Approximately 5:30 AM  I have reviewed the triage vital signs and the nursing notes.   HISTORY  Chief Complaint Herpes Zoster   HPI Jeremy Kerr is a 68 y.o. male with history of IBS who presents for evaluation of shingles.  Patient developed a rash on the left side of his head a week ago.  [facility] where he was started on acyclovir.  He has been taking 5 times a day as prescribed.  The rash has been getting progressively worse.  Now he has significant swelling of his left eyelid.  He denies pain with extraocular movements or changes in vision, he denies fever or chills.  He has noticed some swelling on the back of his neck on the left.  He denies headache.  Past Medical History:  Diagnosis Date   Colon polyps    IBS (irritable bowel syndrome)    Nephrolithiasis     Patient Active Problem List   Diagnosis Date Noted   Headache(784.0) 05/10/2011   COLONIC POLYPS, HX OF 05/31/2007   NEPHROLITHIASIS, HX OF 05/31/2007    Past Surgical History:  Procedure Laterality Date   KNEE ARTHROSCOPY      Prior to Admission medications   Medication Sig Start Date End Date Taking? Authorizing Provider  cephALEXin (KEFLEX) 500 MG capsule Take 1 capsule (500 mg total) by mouth 3 (three) times daily for 7 days. 05/09/19 05/16/19  Nita SickleVeronese, [place], MD  predniSONE (DELTASONE) 20 MG tablet Take 3 tablets (60 mg total) by mouth daily for 4 days. 05/09/19 05/13/19  Nita SickleVeronese, [place], MD  RABEprazole (ACIPHEX) 20 MG tablet Take 1 tablet (20 mg total) by mouth daily. 02/29/12 02/28/13  Mardella LaymanPatterson, David R, MD  valACYclovir (VALTREX) 1000 MG tablet Take 1 tablet (1,000 mg total) by mouth 3 (three) times daily for 7 days. 05/09/19 05/16/19  Nita SickleVeronese, [place], MD    Allergies Patient has no known allergies.  Family History  Problem  Relation Age of Onset   Alcohol abuse Father        cirrhosis   COPD Mother    Colon cancer Neg Hx    Stomach cancer Neg Hx     Social History Social History   Tobacco Use   Smoking status: Never Smoker   Smokeless tobacco: Never Used  Substance Use Topics   Alcohol use: No   Drug use: No    Review of Systems  Constitutional: Negative for fever. Eyes: Negative for visual changes. ENT: Negative for sore throat. Neck: No neck pain  Cardiovascular: Negative for chest pain. Respiratory: Negative for shortness of breath. Gastrointestinal: Negative for abdominal pain, vomiting or diarrhea. Genitourinary: Negative for dysuria. Musculoskeletal: Negative for back pain. Skin: + rash. Neurological: Negative for headaches, weakness or numbness. Psych: No SI or HI  ____________________________________________   PHYSICAL EXAM:  VITAL SIGNS: ED Triage Vitals  Enc Vitals Group     BP 05/09/19 0445 (!) 142/91     Pulse Rate 05/09/19 0445 64     Resp 05/09/19 0445 18     Temp 05/09/19 0445 98.1 F (36.7 C)     Temp Source 05/09/19 0445 Oral     SpO2 05/09/19 0445 97 %     Weight 05/09/19 0441 210 lb (95.3 kg)     Height 05/09/19 0441 6' (1.829 m)     Head Circumference --  Peak Flow --      Pain Score --      Pain Loc --      Pain Edu? --      Excl. in [place]? --     Constitutional: Alert and oriented. Well appearing and in no apparent distress. HEENT:      Head: Normocephalic. Erythematous, vesicular rash involving the L side of patient's scalp, forehead and L eyelid. Rash is warm to the touch with some crusting         Eyes: Left eyelid is extremely swollen.  Conjunctivae are normal. Sclera is non-icteric.  Pupils are equal round and reactive, intact and painless extraocular movements.  Visual acuity symmetric on both eyes.  No corneal lesions.      Mouth/Throat: Mucous membranes are moist.       Neck: Supple with no signs of meningismus. Left occipital  lymphadenopathy Cardiovascular: Regular rate and rhythm. No murmurs, gallops, or rubs. 2+ symmetrical distal pulses are present in all extremities. No JVD. Respiratory: Normal respiratory effort. Lungs are clear to auscultation bilaterally. No wheezes, crackles, or rhonchi.  Musculoskeletal: Nontender with normal range of motion in all extremities. No edema, cyanosis, or erythema of extremities. Neurologic: Normal speech and language. Face is symmetric. Moving all extremities. No gross focal neurologic deficits are appreciated. Skin: Skin is warm, dry and intact. No rash noted. Psychiatric: Mood and affect are normal. Speech and behavior are normal.  ____________________________________________   LABS (all labs ordered are listed, but only abnormal results are displayed)  Labs Reviewed  CBC WITH DIFFERENTIAL/PLATELET  COMPREHENSIVE METABOLIC PANEL   ____________________________________________  EKG  none  ____________________________________________  RADIOLOGY  I have personally reviewed the images performed during this visit and I agree with the Radiologist's read.   Interpretation by Radiologist:  Ct Orbits W Contrast  Result Date: 05/09/2019 CLINICAL DATA:  Left orbital cellulitis EXAM: CT ORBITS WITH CONTRAST TECHNIQUE: Multidetector CT images was performed according to the standard protocol following intravenous contrast administration. CONTRAST:  46mL OMNIPAQUE IOHEXOL 300 MG/ML  SOLN COMPARISON:  None. FINDINGS: Orbits: Soft tissue stranding and thickening on the left that is preseptal. No postseptal inflammation. Normal appearance of the globes, optic nerve sheath complexes, extraocular muscles, and lacrimal glands. Visualized sinuses: Essentially clear Soft tissues: As above.  No collection or opaque foreign body Limited intracranial: Negative IMPRESSION: Left preseptal cellulitis.  No collection. Electronically Signed   By: Monte Fantasia M.D.   On: 05/09/2019 07:18     ____________________________________________   PROCEDURES  Procedure(s) performed: None Procedures Critical Care performed:  None ____________________________________________   INITIAL IMPRESSION / ASSESSMENT AND PLAN / ED COURSE  68 y.o. male with history of IBS who presents for evaluation of shingles involving the left forehead and scalp, also involving the left eyelid.  The fact the rash looks very erythematous and somewhat warm to the touch makes me concerned for possible early bacterial superinfection.  There are no corneal lesions seen with Woods lamp and fluorescein, visual acuity is symmetric bilaterally, no pain with extraocular movements.  CT ocular's pending to rule out orbital cellulitis.  Will give a dose of Rocephin, Solu-Medrol, and IV acyclovir.    _________________________ 7:18 AM on 05/09/2019 -----------------------------------------  Labs with no acute findings.  CT showing no evidence of orbital cellulitis.  Discussed with Dr. Wallace Going from [facility] and he will evaluate patient at 8 AM once patient is discharged from the emergency room.  Plan to discharge home on Valtrex, prednisone, Keflex and  immediate evaluation by the ophthalmologist.  Patient is comfortable with this plan.     As part of my medical decision making, I reviewed the following data within the electronic MEDICAL RECORD NUMBER Nursing notes reviewed and incorporated, Labs reviewed , Old chart reviewed, A consult was requested and obtained from this/these consultant(s) Ophthalmology, Notes from prior ED visits and [place] Controlled Substance Database   Patient was evaluated in Emergency Department today for the symptoms described in the history of present illness. Patient was evaluated in the context of the global COVID-19 pandemic, which necessitated consideration that the patient might be at risk for infection with the SARS-CoV-2 virus that causes COVID-19. Institutional protocols and  algorithms that pertain to the evaluation of patients at risk for COVID-19 are in a state of rapid change based on information released by regulatory bodies including the CDC and federal and state organizations. These policies and algorithms were followed during the patient's care in the ED.   ____________________________________________   FINAL CLINICAL IMPRESSION(S) / ED DIAGNOSES   Final diagnoses:  Herpes zoster with other complication  Cellulitis of face      NEW MEDICATIONS STARTED DURING THIS VISIT:  ED Discharge Orders         Ordered    valACYclovir (VALTREX) 1000 MG tablet  3 times daily     05/09/19 0703    cephALEXin (KEFLEX) 500 MG capsule  3 times daily     05/09/19 0703    predniSONE (DELTASONE) 20 MG tablet  Daily     05/09/19 0703           Note:  This document was prepared using Dragon voice recognition software and may include unintentional dictation errors.    Don PerkingVeronese, [place], MD 05/09/19 [id number]    Nita SickleVeronese, [place], MD 05/09/19 [phone]

## 2019-05-09 NOTE — Discharge Instructions (Signed)
Stop the acyclovir and switch to Valtrex as prescribed.  Take the prednisone as prescribed.  Take the antibiotics as prescribed.  Follow-up with your doctor in 2 days.  Return to the emergency room if you have pain with movement of your eye, fever, or changes in vision. Go to [facility] upon discharge for a formal eye evaluation

## 2021-01-20 IMAGING — CT CT ORBITS WITH CONTRAST
3 of 5 series · 11 of 47 positions shown, 13 images · IV contrast (omnipaque)
Comparison: None.

CLINICAL DATA: Left orbital cellulitis

EXAM:
CT ORBITS WITH CONTRAST
TECHNIQUE: Multidetector CT images was performed according to the standard
protocol following intravenous contrast administration.
CONTRAST:  75mL OMNIPAQUE IOHEXOL 300 MG/ML  SOLN

[Series 3: orbits 2.0 h30s st · axial · 0.34mm/px · z∈[-70,+2]mm · 6 of 46 slices shown, 8 images]
[im 5/46  brain]
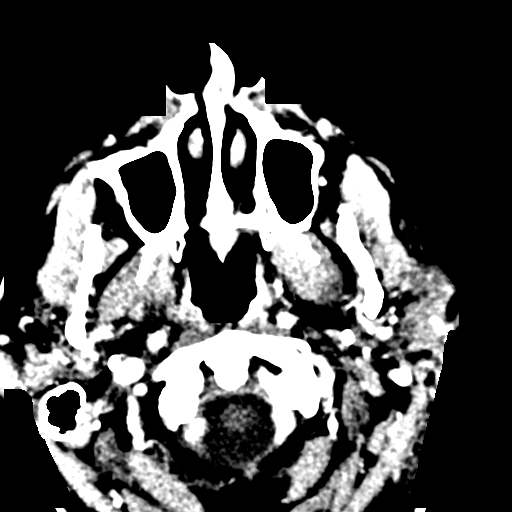
[im 5/46  bone]
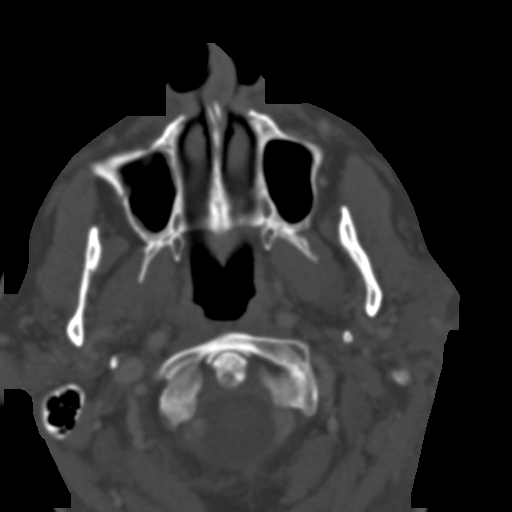
[im 13/46  bone]
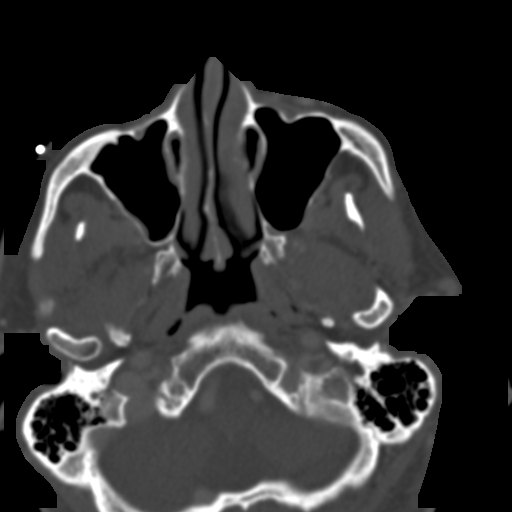
[im 19/46  bone]
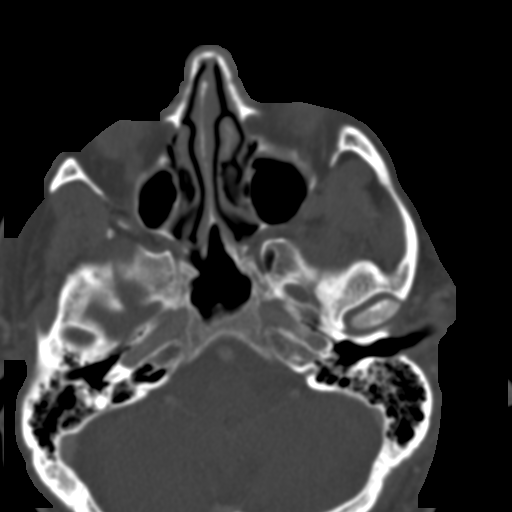
[im 27/46  bone]
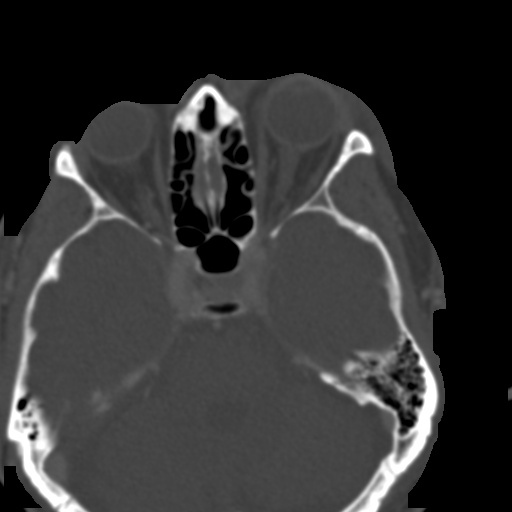
[im 35/46  brain]
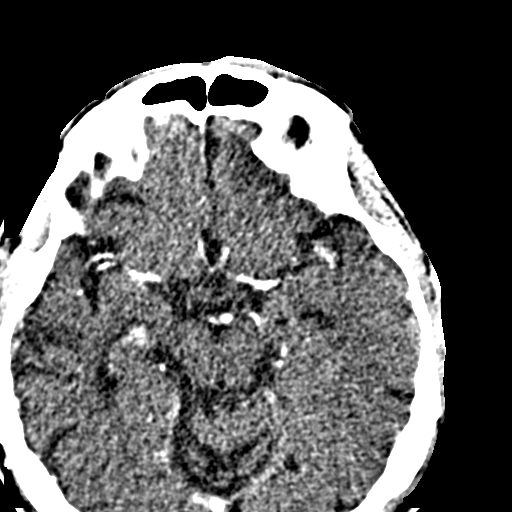
[im 35/46  bone]
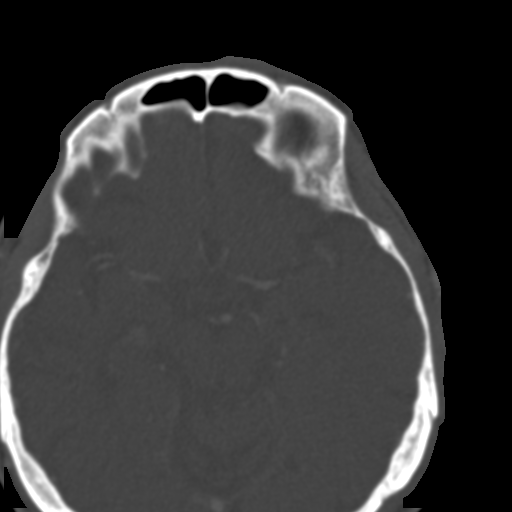
[im 41/46  bone]
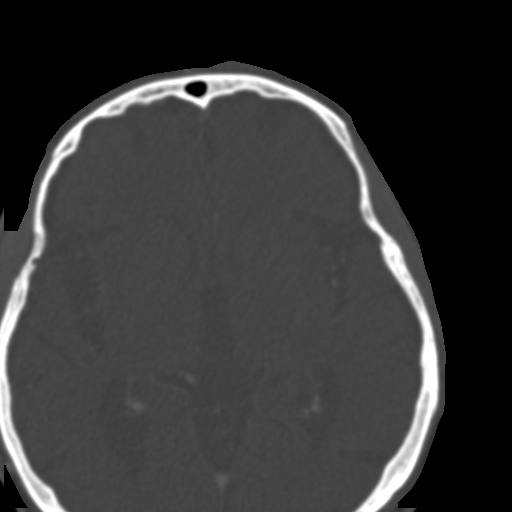

[Series 8: orbits 2.0 coronal · coronal · 0.18mm/px · 3 of 73 slices shown]
[im 19/73  bone]
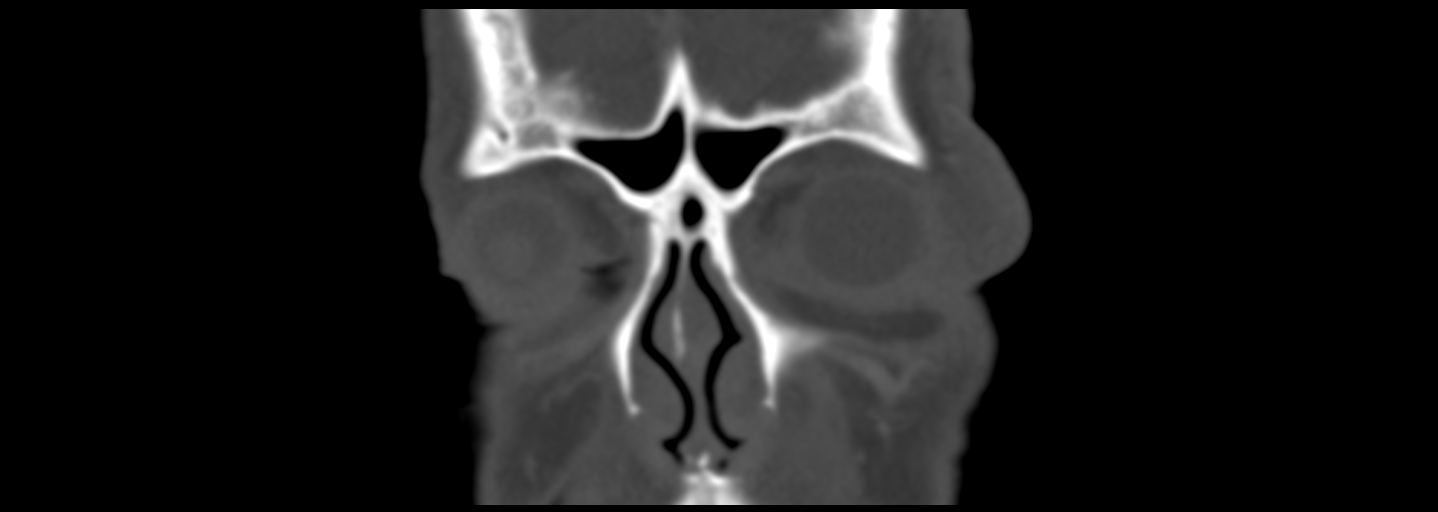
[im 37/73  bone]
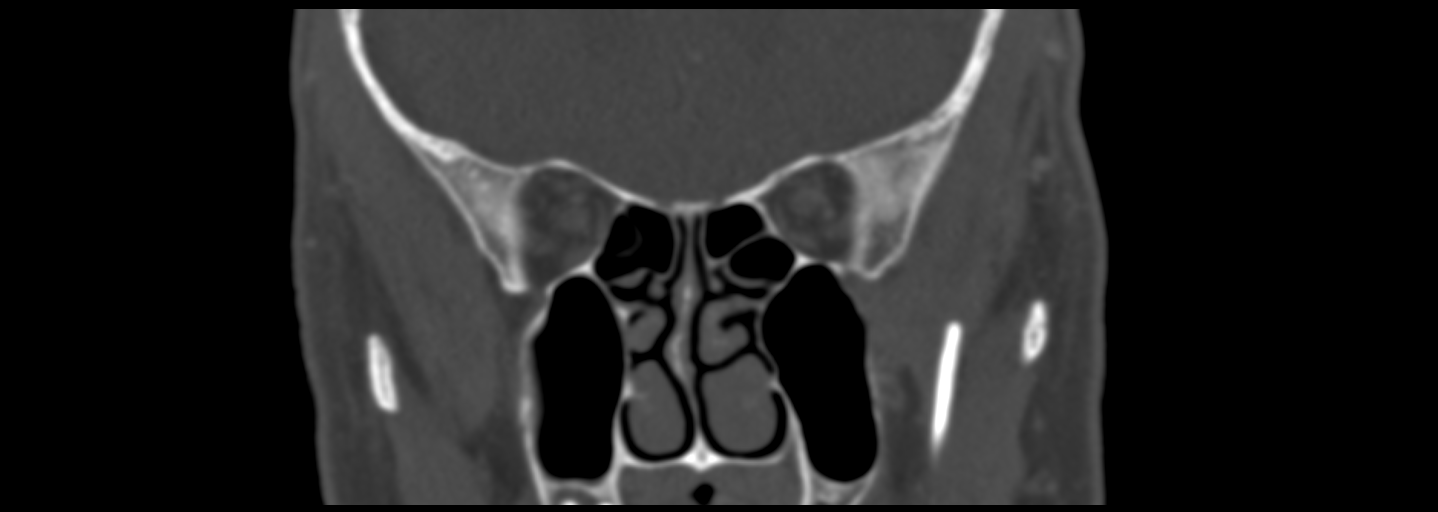
[im 55/73  bone]
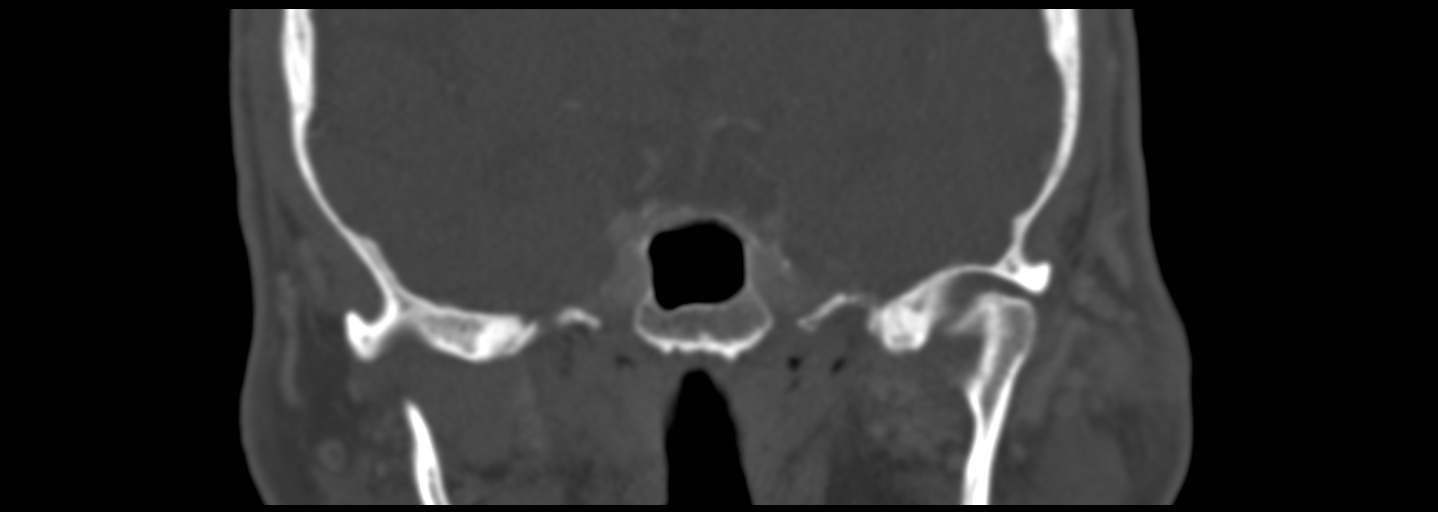

[Series 11: orbits 2.0 coronal st 2 · sagittal · 0.18mm/px · 2 of 120 slices shown]
[im 40/120  bone]
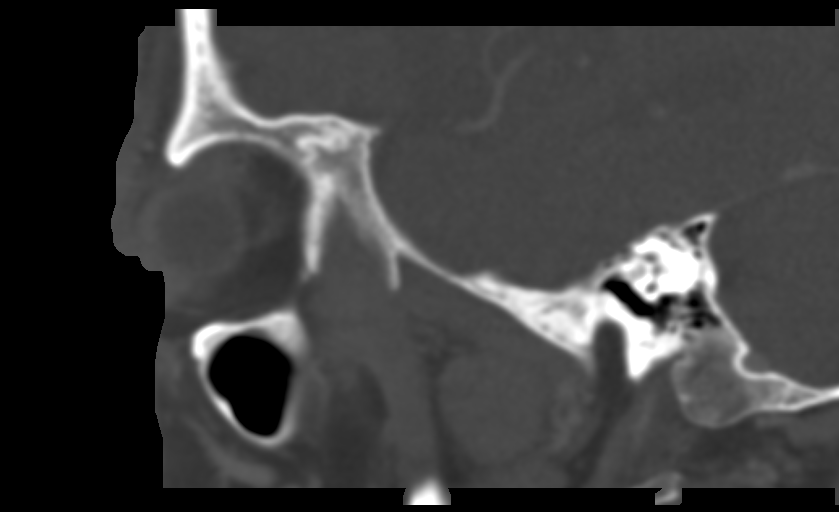
[im 80/120  bone]
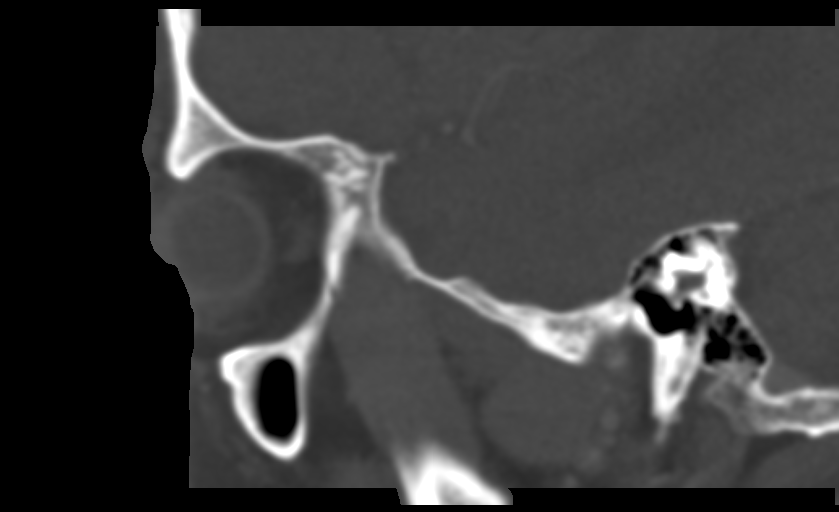

[11 of 47 positions shown; findings below may reference images not displayed]

FINDINGS: Orbits: Soft tissue stranding and thickening on the left that is
preseptal. No postseptal inflammation. Normal appearance of the
globes, optic nerve sheath complexes, extraocular muscles, and
lacrimal glands.

Visualized sinuses: Essentially clear

Soft tissues: As above.  No collection or opaque foreign body

Limited intracranial: Negative
IMPRESSION: Left preseptal cellulitis.  No collection.

## 2022-04-04 ENCOUNTER — Encounter: Payer: Self-pay | Admitting: Internal Medicine

## 2023-02-21 ENCOUNTER — Other Ambulatory Visit: Payer: Self-pay

## 2023-02-21 ENCOUNTER — Emergency Department: Payer: No Typology Code available for payment source

## 2023-02-21 ENCOUNTER — Encounter: Payer: Self-pay | Admitting: Emergency Medicine

## 2023-02-21 DIAGNOSIS — R079 Chest pain, unspecified: Secondary | ICD-10-CM | POA: Diagnosis present

## 2023-02-21 LAB — CBC
HCT: 48.5 % (ref 39.0–52.0)
Hemoglobin: 15.8 g/dL (ref 13.0–17.0)
MCH: 31 pg (ref 26.0–34.0)
MCHC: 32.6 g/dL (ref 30.0–36.0)
MCV: 95.3 fL (ref 80.0–100.0)
Platelets: 204 10*3/uL (ref 150–400)
RBC: 5.09 MIL/uL (ref 4.22–5.81)
RDW: 12.6 % (ref 11.5–15.5)
WBC: 5.9 10*3/uL (ref 4.0–10.5)
nRBC: 0 % (ref 0.0–0.2)

## 2023-02-21 NOTE — ED Triage Notes (Signed)
Pt to triage via w/c with no distress noted; reports left sided CP radiating into left shoulder tonight; denies any accomp symptoms; denies hx of same; st had similar episode earlier today as well; 81mg  ASA taken PTA

## 2023-02-22 ENCOUNTER — Emergency Department
Admission: EM | Admit: 2023-02-22 | Discharge: 2023-02-22 | Disposition: A | Discharge status: Home / Self Care | Payer: No Typology Code available for payment source | Attending: Emergency Medicine | Admitting: Emergency Medicine

## 2023-02-22 DIAGNOSIS — R079 Chest pain, unspecified: Secondary | ICD-10-CM

## 2023-02-22 LAB — BASIC METABOLIC PANEL
Anion gap: 8 (ref 5–15)
BUN: 16 mg/dL (ref 8–23)
CO2: 26 mmol/L (ref 22–32)
Calcium: 9.2 mg/dL (ref 8.9–10.3)
Chloride: 107 mmol/L (ref 98–111)
Creatinine, Ser: 1.43 mg/dL — ABNORMAL HIGH (ref 0.61–1.24)
GFR, Estimated: 52 mL/min — ABNORMAL LOW (ref >60)
Glucose, Bld: 108 mg/dL — ABNORMAL HIGH (ref 70–99)
Potassium: 3.7 mmol/L (ref 3.5–5.1)
Sodium: 141 mmol/L (ref 135–145)

## 2023-02-22 LAB — TROPONIN I (HIGH SENSITIVITY)
Troponin I (High Sensitivity): 10 ng/L (ref <18)
Troponin I (High Sensitivity): 13 ng/L (ref <18)

## 2023-02-22 NOTE — ED Provider Notes (Signed)
[facility] Provider Note    Event Date/Time   First MD Initiated Contact with Patient 02/22/23 0111     (approximate)   History   Chest Pain   HPI Jeremy Kerr is a 72 y.o. male who reports no prior history of heart problems, lung problems, or any other chronic issue.  He goes to the [place] every 6 months for checkup but has no cardiologist or ongoing medical issues.  He presents tonight for evaluation of acute onset left-sided chest pain that radiates into his shoulder.  He had an episode this morning when he was out doing some light gardening but with no heavy exertion.  It went away after about an hour.  He has continued to have some pain in his shoulder but it does not seem to be reproducible with movement or palpation.  He did not think much of it and went to sleep, but then he woke up from sleep with worsening sharp chest pain.  The pain has completely gone away in his chest but some pain remains in his shoulder.  He has never had similar symptoms.  He has no shortness of breath, fever, cough, nausea, vomiting, abdominal pain, sweating, dizziness.  He has no first-degree relatives that have had heart attacks.  He has not been on any long trips recently or had any immobilizations.  He does not smoke or drink.     Physical Exam   Triage Vital Signs: ED Triage Vitals  Enc Vitals Group     BP 02/21/23 2318 138/82     Pulse Rate 02/21/23 2318 60     Resp 02/21/23 2318 15     Temp 02/21/23 2318 98.1 F (36.7 C)     Temp Source 02/21/23 2318 Oral     SpO2 02/21/23 2318 95 %     Weight 02/21/23 2311 97.1 kg (214 lb)     Height 02/21/23 2311 1.88 m (6\' 2" )     Head Circumference --      Peak Flow --      Pain Score 02/21/23 2311 5     Pain Loc --      Pain Edu? --      Excl. in [place]? --     Most recent vital signs: Vitals:   02/21/23 2318 02/22/23 0308  BP: 138/82 123/80  Pulse: 60 (!) 48  Resp: 15 14  Temp: 98.1 F (36.7 C)   SpO2: 95% 98%     General: Awake, no distress.  CV:  Good peripheral perfusion.  Regular rate and rhythm.  No abnormal heart sounds. Resp:  Normal effort. Speaking easily and comfortably, no accessory muscle usage nor intercostal retractions.  Lungs are clear to auscultation. Abd:  No distention.  No tenderness to palpation.   ED Results / Procedures / Treatments   Labs (all labs ordered are listed, but only abnormal results are displayed) Labs Reviewed  BASIC METABOLIC PANEL - Abnormal; Notable for the following components:      Result Value   Glucose, Bld 108 (*)    Creatinine, Ser 1.43 (*)    GFR, Estimated 52 (*)    All other components within normal limits  CBC  TROPONIN I (HIGH SENSITIVITY)  TROPONIN I (HIGH SENSITIVITY)     EKG  ED ECG REPORT I, Loleta Rose, the attending physician, personally viewed and interpreted this ECG.  Date: 02/21/2023 EKG Time: 23: 12 Rate: 60 Rhythm: Sinus rhythm with first-degree AV block QRS Axis:  normal Intervals: Left anterior fascicular block ST/T Wave abnormalities: Non-specific ST segment / T-wave changes, but no clear evidence of acute ischemia. Narrative Interpretation: no definitive evidence of acute ischemia; does not meet STEMI criteria.    RADIOLOGY I viewed and interpreted the patient's 1 view chest x-ray.  No evidence of pneumonia, pneumothorax, nor other acute abnormality.  I also read the radiologist's report, which confirmed no acute findings.   PROCEDURES:  Critical Care performed: No  .1-3 Lead EKG Interpretation  Performed by: Loleta Rose, MD Authorized by: Loleta Rose, MD     Interpretation: normal     ECG rate:  60   ECG rate assessment: normal     Rhythm: sinus rhythm     Ectopy: none     Conduction: normal       IMPRESSION / MDM / ASSESSMENT AND PLAN / ED COURSE  I reviewed the triage vital signs and the nursing notes.                              Differential diagnosis includes, but is not limited  to, angina, ACS including unstable angina, musculoskeletal strain, pneumonia, PE, pneumothorax.  Patient's presentation is most consistent with acute presentation with potential threat to life or bodily function.  Labs/studies ordered: BMP, CBC, high-sensitivity troponin x 2, one-view portable chest x-ray, EKG  Interventions/Medications given:  Medications - No data to display  (Note:  hospital course my include additional interventions and/or labs/studies not listed above.)   Vital signs are stable within normal limits.  He became slightly bradycardic but asymptomatic towards the end of his visit, likely because he was no longer in any pain and was resting.  No respiratory complaints.  Low risk for ACS based on no risk factors, but his symptoms are certainly concerning.  The patient is on the cardiac monitor to evaluate for evidence of arrhythmia and/or significant heart rate changes.  No suspicion for PE (Wells score for PE is 0).  I believe that cardiac chest pain is the most likely.  However he has a reassuring EKG and a negative troponin and spite of hours of symptoms.  I offered admission for unstable angina, but he very much does not want to stay in the hospital and does not want to be transferred to the [place].  As an alternative, I suggested we get a second troponin and refer him to cardiology for close follow-up if he remains pain-free and has a second troponin that is within normal limits.  He agrees with this plan.   Clinical Course as of 02/22/23 0428  Wed Feb 22, 2023  0301 Negative second troponin.  Patient remains asymptomatic.  I again offered hospitalization but he declines.  Referring him to cardiology as an outpatient.  Gave strict return precautions. [CF]    Clinical Course User Index [CF] Loleta Rose, MD     FINAL CLINICAL IMPRESSION(S) / ED DIAGNOSES   Final diagnoses:  Chest pain, unspecified type     Rx / DC Orders   ED Discharge Orders           Ordered    Ambulatory referral to Cardiology       Comments: If you have not heard from the Cardiology office within the next 72 hours please call [phone].   02/22/23 0302             Note:  This document was prepared using Dragon voice recognition software  and may include unintentional dictation errors.   Loleta Rose, MD 02/22/23 [phone]

## 2023-02-22 NOTE — ED Notes (Signed)
Pt up to restroom.

## 2023-02-22 NOTE — Discharge Instructions (Signed)

## 2023-03-19 ENCOUNTER — Encounter (HOSPITAL_COMMUNITY): Payer: Self-pay | Admitting: Pharmacy Technician

## 2023-03-19 ENCOUNTER — Observation Stay (HOSPITAL_COMMUNITY)
Admission: EM | Admit: 2023-03-19 | Discharge: 2023-03-21 | Disposition: A | Discharge status: Home / Self Care | Payer: No Typology Code available for payment source | Attending: Internal Medicine | Admitting: Internal Medicine

## 2023-03-19 ENCOUNTER — Other Ambulatory Visit: Payer: Self-pay

## 2023-03-19 ENCOUNTER — Emergency Department (HOSPITAL_COMMUNITY): Payer: No Typology Code available for payment source

## 2023-03-19 DIAGNOSIS — R0789 Other chest pain: Secondary | ICD-10-CM | POA: Diagnosis present

## 2023-03-19 DIAGNOSIS — N1831 Chronic kidney disease, stage 3a: Secondary | ICD-10-CM | POA: Diagnosis not present

## 2023-03-19 DIAGNOSIS — Z79899 Other long term (current) drug therapy: Secondary | ICD-10-CM | POA: Insufficient documentation

## 2023-03-19 DIAGNOSIS — I200 Unstable angina: Principal | ICD-10-CM

## 2023-03-19 DIAGNOSIS — R0600 Dyspnea, unspecified: Secondary | ICD-10-CM

## 2023-03-19 DIAGNOSIS — R079 Chest pain, unspecified: Secondary | ICD-10-CM | POA: Diagnosis not present

## 2023-03-19 DIAGNOSIS — R001 Bradycardia, unspecified: Secondary | ICD-10-CM | POA: Diagnosis not present

## 2023-03-19 LAB — CBC
HCT: 48.5 % (ref 39.0–52.0)
HCT: 50.3 % (ref 39.0–52.0)
Hemoglobin: 15.6 g/dL (ref 13.0–17.0)
Hemoglobin: 16.5 g/dL (ref 13.0–17.0)
MCH: 30.1 pg (ref 26.0–34.0)
MCH: 31.3 pg (ref 26.0–34.0)
MCHC: 32.2 g/dL (ref 30.0–36.0)
MCHC: 32.8 g/dL (ref 30.0–36.0)
MCV: 93.6 fL (ref 80.0–100.0)
MCV: 95.4 fL (ref 80.0–100.0)
Platelets: 212 10*3/uL (ref 150–400)
Platelets: 212 10*3/uL (ref 150–400)
RBC: 5.18 MIL/uL (ref 4.22–5.81)
RBC: 5.27 MIL/uL (ref 4.22–5.81)
RDW: 12.5 % (ref 11.5–15.5)
RDW: 12.6 % (ref 11.5–15.5)
WBC: 6.8 10*3/uL (ref 4.0–10.5)
WBC: 7.6 10*3/uL (ref 4.0–10.5)
nRBC: 0 % (ref 0.0–0.2)
nRBC: 0 % (ref 0.0–0.2)

## 2023-03-19 LAB — BASIC METABOLIC PANEL
Anion gap: 7 (ref 5–15)
BUN: 18 mg/dL (ref 8–23)
CO2: 25 mmol/L (ref 22–32)
Calcium: 9.6 mg/dL (ref 8.9–10.3)
Chloride: 108 mmol/L (ref 98–111)
Creatinine, Ser: 1.34 mg/dL — ABNORMAL HIGH (ref 0.61–1.24)
GFR, Estimated: 56 mL/min — ABNORMAL LOW (ref >60)
Glucose, Bld: 105 mg/dL — ABNORMAL HIGH (ref 70–99)
Potassium: 4.5 mmol/L (ref 3.5–5.1)
Sodium: 140 mmol/L (ref 135–145)

## 2023-03-19 LAB — CREATININE, SERUM
Creatinine, Ser: 1.31 mg/dL — ABNORMAL HIGH (ref 0.61–1.24)
GFR, Estimated: 57 mL/min — ABNORMAL LOW (ref >60)

## 2023-03-19 LAB — TROPONIN I (HIGH SENSITIVITY)
Troponin I (High Sensitivity): 9 ng/L (ref <18)
Troponin I (High Sensitivity): 8 ng/L (ref <18)

## 2023-03-19 MED ORDER — ENOXAPARIN SODIUM 40 MG/0.4ML IJ SOSY
40.0000 mg | PREFILLED_SYRINGE | INTRAMUSCULAR | Status: DC
  Administered 2023-03-19: 40 mg via SUBCUTANEOUS
  Filled 2023-03-19: qty 0.4

## 2023-03-19 MED ORDER — ASPIRIN 81 MG PO CHEW: 162.0000 mg | CHEWABLE_TABLET | Freq: Once | ORAL | Status: DC

## 2023-03-19 MED ORDER — ASPIRIN 81 MG PO CHEW: 243.0000 mg | CHEWABLE_TABLET | Freq: Once | ORAL | Status: DC

## 2023-03-19 MED ORDER — ASPIRIN 81 MG PO CHEW
81.0000 mg | CHEWABLE_TABLET | Freq: Once | ORAL | Status: AC
  Administered 2023-03-19: 81 mg via ORAL

## 2023-03-19 MED ORDER — SODIUM CHLORIDE 0.45 % IV SOLN: INTRAVENOUS | Status: AC

## 2023-03-19 MED ORDER — ASPIRIN 81 MG PO CHEW
324.0000 mg | CHEWABLE_TABLET | Freq: Once | ORAL | Status: DC
  Filled 2023-03-19: qty 4

## 2023-03-19 MED ORDER — PANTOPRAZOLE SODIUM 40 MG PO TBEC
40.0000 mg | DELAYED_RELEASE_TABLET | Freq: Every day | ORAL | Status: DC
  Administered 2023-03-19 – 2023-03-21 (×3): 40 mg via ORAL
  Filled 2023-03-19 (×3): qty 1

## 2023-03-19 NOTE — Plan of Care (Signed)
  Problem: Education: Goal: Knowledge of General Education information will improve Description: Including pain rating scale, medication(s)/side effects and non-pharmacologic comfort measures Outcome: Progressing   Problem: Health Behavior/Discharge Planning: Goal: Ability to manage health-related needs will improve Outcome: Progressing   Problem: Clinical Measurements: Goal: Cardiovascular complication will be avoided Outcome: Progressing   Problem: Pain Managment: Goal: General experience of comfort will improve Outcome: Progressing   Problem: Safety: Goal: Ability to remain free from injury will improve Outcome: Progressing   

## 2023-03-19 NOTE — ED Notes (Signed)
ED TO INPATIENT HANDOFF REPORT  ED Nurse Name and Phone #: Juliette Alcide RN [phone]  S Name/Age/Gender Jeremy Kerr 72 y.o. male Room/Bed: 035C/035C  Code Status   Code Status: Full Code  Home/SNF/Other Home Patient oriented to: self, place, time, and situation Is this baseline? Yes   Triage Complete: Triage complete  Chief Complaint Chest pain [R07.9]  Triage Note Pt here POV with reports of L sided chest pain, worse with exertion. States he has had this pain before but it usually goes away. Now any exertion brings the pain on worse than before.    Allergies No Known Allergies  Level of Care/Admitting Diagnosis ED Disposition     ED Disposition  Admit   Condition  --   Comment  Hospital Area: [facility] [[id number]]  Level of Care: Telemetry Cardiac [103]  May place patient in observation at [facility] or [facility] if equivalent level of care is available:: No  Covid Evaluation: Asymptomatic - no recent exposure (last 10 days) testing not required  Diagnosis: Chest pain [147829]  Admitting Physician: Darlin Drop [[id number]]  Attending Physician: Darlin Drop [[id number]]          B Medical/Surgery History Past Medical History:  Diagnosis Date   Colon polyps    IBS (irritable bowel syndrome)    Nephrolithiasis    Past Surgical History:  Procedure Laterality Date   KNEE ARTHROSCOPY       A IV Location/Drains/Wounds Patient Lines/Drains/Airways Status     Active Line/Drains/Airways     Name Placement date Placement time Site Days   Peripheral IV 03/19/23 18 G Right Antecubital 03/19/23  1700  Antecubital  less than 1            Intake/Output Last 24 hours No intake or output data in the 24 hours ending 03/19/23 2046  Labs/Imaging Results for orders placed or performed during the hospital encounter of 03/19/23 (from the past 48 hour(s))  Basic metabolic panel     Status: Abnormal   Collection Time: 03/19/23  2:34 PM  Result  Value Ref Range   Sodium 140 135 - 145 mmol/L   Potassium 4.5 3.5 - 5.1 mmol/L   Chloride 108 98 - 111 mmol/L   CO2 25 22 - 32 mmol/L   Glucose, Bld 105 (H) 70 - 99 mg/dL    Comment: Glucose reference range applies only to samples taken after fasting for at least 8 hours.   BUN 18 8 - 23 mg/dL   Creatinine, Ser 6.29 (H) 0.61 - 1.24 mg/dL   Calcium 9.6 8.9 - 52.8 mg/dL   GFR, Estimated 56 (L) >60 mL/min    Comment: (NOTE) Calculated using the CKD-EPI Creatinine Equation (2021)    Anion gap 7 5 - 15    Comment: Performed at [facility], [address] [place], [place] [address]  CBC     Status: None   Collection Time: 03/19/23  2:34 PM  Result Value Ref Range   WBC 6.8 4.0 - 10.5 K/uL   RBC 5.18 4.22 - 5.81 MIL/uL   Hemoglobin 15.6 13.0 - 17.0 g/dL   HCT 40.1 02.7 - 25.3 %   MCV 93.6 80.0 - 100.0 fL   MCH 30.1 26.0 - 34.0 pg   MCHC 32.2 30.0 - 36.0 g/dL   RDW 66.4 40.3 - 47.4 %   Platelets 212 150 - 400 K/uL   nRBC 0.0 0.0 - 0.2 %    Comment:  Performed at [facility], [address], [place], [place] [address]  Troponin I (High Sensitivity)     Status: None   Collection Time: 03/19/23  2:34 PM  Result Value Ref Range   Troponin I (High Sensitivity) 9 <18 ng/L    Comment: (NOTE) Elevated high sensitivity troponin I (hsTnI) values and significant  changes across serial measurements may suggest ACS but many other  chronic and acute conditions are known to elevate hsTnI results.  Refer to the "Links" section for chest pain algorithms and additional  guidance. Performed at [facility], [address] [place], [place] [address]   Troponin I (High Sensitivity)     Status: None   Collection Time: 03/19/23  5:00 PM  Result Value Ref Range   Troponin I (High Sensitivity) 8 <18 ng/L    Comment: (NOTE) Elevated high sensitivity troponin I (hsTnI) values and significant  changes across serial measurements may suggest ACS but many other  chronic and acute  conditions are known to elevate hsTnI results.  Refer to the "Links" section for chest pain algorithms and additional  guidance. Performed at [facility], [address], [place], [place] [address]   CBC     Status: None   Collection Time: 03/19/23  8:25 PM  Result Value Ref Range   WBC 7.6 4.0 - 10.5 K/uL   RBC 5.27 4.22 - 5.81 MIL/uL   Hemoglobin 16.5 13.0 - 17.0 g/dL   HCT 38.1 01.7 - 51.0 %   MCV 95.4 80.0 - 100.0 fL   MCH 31.3 26.0 - 34.0 pg   MCHC 32.8 30.0 - 36.0 g/dL   RDW 25.8 52.7 - 78.2 %   Platelets 212 150 - 400 K/uL   nRBC 0.0 0.0 - 0.2 %    Comment: Performed at [facility], [address] [place], [place] [address]   DG Chest 2 View  Result Date: 03/19/2023 CLINICAL DATA:  Chest pain EXAM: CHEST - 2 VIEW COMPARISON:  X-ray 02/21/2023 FINDINGS: No consolidation, pneumothorax or effusion. No edema. Normal cardiopericardial silhouette. Degenerative changes are seen along the spine. IMPRESSION: No acute cardiopulmonary disease. Electronically Signed   By: Karen Kays M.D.   On: 03/19/2023 15:05    Pending Labs Unresulted Labs (From admission, onward)     Start     Ordered   03/26/23 0500  Creatinine, serum  (enoxaparin (LOVENOX)    CrCl >/= 30 ml/min)  Weekly,   R     Comments: while on enoxaparin therapy    03/19/23 1939   03/20/23 0500  Lipid panel  Tomorrow morning,   R        03/19/23 1949   03/20/23 0500  CBC  Tomorrow morning,   R        03/19/23 1953   03/20/23 0500  Basic metabolic panel  Tomorrow morning,   R        03/19/23 1953   03/20/23 0500  Magnesium  Tomorrow morning,   R        03/19/23 1953   03/19/23 1936  Creatinine, serum  (enoxaparin (LOVENOX)    CrCl >/= 30 ml/min)  Once,   R       Comments: Baseline for enoxaparin therapy IF NOT ALREADY DRAWN.    03/19/23 1939            Vitals/Pain Today's Vitals   03/19/23 1700 03/19/23 1930 03/19/23 1932 03/19/23 2000  BP: (!) 142/94 Marland Kitchen)  138/94  102/60  Pulse: (!) 49 (!) 55   (!) 57  Resp: 11 18  13   Temp:   98.5 F (36.9 C)   TempSrc:   Oral   SpO2: 99% 96%  97%  PainSc:        Isolation Precautions No active isolations  Medications Medications  enoxaparin (LOVENOX) injection 40 mg (40 mg Subcutaneous Given 03/19/23 2023)  0.45 % sodium chloride infusion ( Intravenous New Bag/Given 03/19/23 2023)  pantoprazole (PROTONIX) EC tablet 40 mg (40 mg Oral Given 03/19/23 2023)  aspirin chewable tablet 81 mg (81 mg Oral Given 03/19/23 1721)    Mobility walks     Focused Assessments Cardiac Assessment Handoff:  Cardiac Rhythm: Sinus bradycardia No results found for: "CKTOTAL", "CKMB", "CKMBINDEX", "TROPONINI" No results found for: "DDIMER" Does the Patient currently have chest pain? No    R Recommendations: See Admitting Provider Note  Report given to:   Additional Notes: Pt A&Ox4. Ambulatory and continent.

## 2023-03-19 NOTE — ED Provider Notes (Signed)
[facility] AT [facility] Provider Note   CSN: [id number] Arrival date & time: 03/19/23  1420     History  Chief Complaint  Patient presents with   Chest Pain    Jeremy Kerr is a 72 y.o. male past medical history significant for sleep apnea here for evaluation of chest pain.  Has been ongoing over the last few weeks.  Initially started out as an aching to the left side of his chest, worse with exertion, improved with rest.  Subsequently developed exertional chest pain with associated shortness of breath, lightheadedness and pain into his left arm.  Again typically relieved with rest.  He was seen by [facility] on 02/22/2023.  There was concern for unstable angina and offered admission however patient declined and [facility]  [facility].  Apparently was supposed to have a stress test performed however states he could not "do it.".  He returns today due to worsening chest pain over the last few days.  Previously was only associated with exertion however now happening intermittently with rest.  Last episode this morning.  Lasted approximately 15 minutes and resolved.  Episode yesterday evening lasted about an hour after attempting to get up and brush his teeth.  He denies any numbness, weakness.  No PND orthopnea.  Has been in otherwise normal health.  No recent illnesses.  No recent surgery, immobilization, malignancy.  No fever, cough, back pain, abdominal pain  Wife states actually patient took 3 baby aspirin this morning.    HPI     Home Medications Prior to Admission medications   Medication Sig Start Date End Date Taking? Authorizing Provider  RABEprazole (ACIPHEX) 20 MG tablet Take 1 tablet (20 mg total) by mouth daily. 02/29/12 02/28/13  Mardella Layman, MD      Allergies    Patient has no known allergies.    Review of Systems   Review of Systems  Constitutional: Negative.   HENT:  Negative.    Respiratory:  Positive for shortness of breath.   Cardiovascular:  Positive for chest pain. Negative for palpitations and leg swelling.  Gastrointestinal:  Positive for nausea.  Genitourinary: Negative.   Musculoskeletal: Negative.   Skin: Negative.   Neurological:  Positive for light-headedness.  All other systems reviewed and are negative.   Physical Exam Updated Vital Signs BP (!) 138/94   Pulse (!) 55   Temp 98.5 F (36.9 C) (Oral)   Resp 18   SpO2 96%  Physical Exam Vitals and nursing note reviewed.  Constitutional:      General: He is not in acute distress.    Appearance: He is well-developed. He is not ill-appearing, toxic-appearing or diaphoretic.  HENT:     Head: Atraumatic.  Eyes:     Pupils: Pupils are equal, round, and reactive to light.  Cardiovascular:     Rate and Rhythm: Normal rate and regular rhythm.     Pulses:          Radial pulses are 2+ on the right side and 2+ on the left side.       Dorsalis pedis pulses are 2+ on the right side and 2+ on the left side.     Heart sounds: Normal heart sounds.  Pulmonary:     Effort: Pulmonary effort is normal. No respiratory distress.     Breath sounds: Normal breath sounds.  Abdominal:     General:  Bowel sounds are normal. There is no distension.     Palpations: Abdomen is soft.  Musculoskeletal:        General: Normal range of motion.     Cervical back: Normal range of motion and neck supple.     Right lower leg: No tenderness. No edema.     Left lower leg: No tenderness. No edema.  Skin:    General: Skin is warm and dry.     Capillary Refill: Capillary refill takes less than 2 seconds.  Neurological:     General: No focal deficit present.     Mental Status: He is alert and oriented to person, place, and time.     ED Results / Procedures / Treatments   Labs (all labs ordered are listed, but only abnormal results are displayed) Labs Reviewed  BASIC METABOLIC PANEL - Abnormal; Notable for  the following components:      Result Value   Glucose, Bld 105 (*)    Creatinine, Ser 1.34 (*)    GFR, Estimated 56 (*)    All other components within normal limits  CBC  CBC  CREATININE, SERUM  TROPONIN I (HIGH SENSITIVITY)  TROPONIN I (HIGH SENSITIVITY)    EKG EKG Interpretation  Date/Time:  Sunday March 19 2023 14:14:33 EDT Ventricular Rate:  52 PR Interval:  232 QRS Duration: 108 QT Interval:  420 QTC Calculation: 390 R Axis:   -53 Text Interpretation: Sinus bradycardia with 1st degree A-V block Left anterior fascicular block T wave abnormality Abnormal ECG Confirmed by Gerhard Munch [phone]) on 03/19/2023 5:07:41 PM  Radiology DG Chest 2 View  Result Date: 03/19/2023 CLINICAL DATA:  Chest pain EXAM: CHEST - 2 VIEW COMPARISON:  X-ray 02/21/2023 FINDINGS: No consolidation, pneumothorax or effusion. No edema. Normal cardiopericardial silhouette. Degenerative changes are seen along the spine. IMPRESSION: No acute cardiopulmonary disease. Electronically Signed   By: Karen Kays M.D.   On: 03/19/2023 15:05    Procedures .Critical Care  Performed by: Linwood Dibbles, PA-C Authorized by: Linwood Dibbles, PA-C   Critical care provider statement:    Critical care time (minutes):  35   Critical care was necessary to treat or prevent imminent or life-threatening deterioration of the following conditions:  Cardiac failure   Critical care was time spent personally by me on the following activities:  Development of treatment plan with patient or surrogate, discussions with consultants, evaluation of patient's response to treatment, examination of patient, ordering and review of laboratory studies, ordering and review of radiographic studies, ordering and performing treatments and interventions, pulse oximetry, re-evaluation of patient's condition and review of old charts     Medications Ordered in ED Medications  enoxaparin (LOVENOX) injection 40 mg (has no administration in  time range)  aspirin chewable tablet 81 mg (81 mg Oral Given 03/19/23 1721)    ED Course/ Medical Decision Making/ A&P Clinical Course as of 03/19/23 1943  Wynelle Link Mar 19, 2023  1934 Margo Aye [BH]    Clinical Course User Index [BH] John Williamsen A, PA-C   72 year old male here for evaluation of chest pain.  He is afebrile, nonseptic, not ill-appearing.  Has been going on the last few weeks.  Seen at outside hospital 02/22/2023 where he had acute episode of chest pain which radiated to his left arm as well as some lightheadedness.  He had negative troponin.  He was offered admission however he declined and wanted to follow-up the [place].  He states he continues to have intermittent  symptoms typically exertional in nature, resolved with rest over the last few days he has had symptoms unassociated with exertion.  Pain goes into his left arm he becomes lightheaded.  No headache, numbness or weakness.  No PND orthopnea.  No cough, fever, recent illnesses.  Last episode this morning, not associated with exertion.  Episode yesterday lasted about an hour.  Is currently chest pain-free.  Took aspirin yesterday, not today.  Typically is very active however has not been able to do his normal activities since his symptoms have worsened.  Labs and imaging personally viewed and interpreted:  Chest x-ray without cardiomegaly, pulm edema, pneumothorax CBC without leukocytosis Metabolic panel creatinine 1.34 Delta troponin flat EKG without ischemic changes.  First-degree AV block, similar to prior EKG  Patient given aspirin here in ED. he is currently chest pain-free.  Will touch base with cardiology.  Patient and wife now states that patient took 3 baby aspirin this morning.  He was subsequently only given 81 mg of aspirin here to complete his full dose  Has had some intermittent bradycardia into the 40s, first-degree AV block on EKG, no lightheadedness or dizziness, appears to be asymptomatic as far as this is  concerned.  Considered pneumonia, pneumothorax, dissection, PE, traumatic injury however patient's history and exam concerning for unstable angina.  I feel it is prudent that he be admitted for further workup and Cardiology  1900: CONSULT Cardiology, Dr. Welton Flakes.  Agrees with medicine admission, cardiology will see in consult.  Plan for stress test tomorrow.  1934: CONSULT Dr. Margo Aye with hospitalist who agrees to evaluate patient for admission  The patient appears reasonably stabilized for admission considering the current resources, flow, and capabilities available in the ED at this time, and I doubt any other [facility] requiring further screening and/or treatment in the ED prior to admission.                              Medical Decision Making Amount and/or Complexity of Data Reviewed Independent Historian: spouse External Data Reviewed: labs, radiology, ECG and notes. Labs: ordered. Decision-making details documented in ED Course. Radiology: ordered and independent interpretation performed. Decision-making details documented in ED Course. ECG/medicine tests: ordered and independent interpretation performed. Decision-making details documented in ED Course.  Risk OTC drugs. Prescription drug management. Parenteral controlled substances. Decision regarding hospitalization.          Final Clinical Impression(s) / ED Diagnoses Final diagnoses:  Unstable angina [facility])    Rx / DC Orders ED Discharge Orders     None         Lisbet Busker A, PA-C 03/19/23 1943    Gerhard Munch, MD 03/21/23 0002

## 2023-03-19 NOTE — H&P (Addendum)
History and Physical  Jeremy Kerr QMV:[id number] DOB: 1950/12/15 DOA: 03/19/2023  Referring physician: Ralph Leyden, PA-EDP  [facility]  Outpatient Specialists: None Patient coming from: Home  Chief Complaint: Chest pain   HPI: Jeremy Kerr is a 72 y.o. Kerr with medical history significant for Kerr, Jeremy Kerr, Jeremy Kerr, Jeremy Kerr, Jeremy presented to [facility] ED from home due to ongoing intermittent chest pain.  The patient presented 3 weeks ago at [facility] for the same.  He was offered admission however he declined, wanted to follow-up with the [facility].  For the last few days his chest pain returned.  Usually it would last 10-15 minutes.  Yesterday he had another episode of chest pain with minimal exertion.  It lasted more than 1 hour.  He describes it as sharp, left sided and radiating to his left arm.  Pain improves after rest.  Pain persists today but it is mild compared to yesterday.  He took 2 baby aspirin prior to presenting to the ED.  Denies any new stressors in his life.    In the ED, high-sensitivity troponin negative x 2, twelve-lead EKG with no evidence of acute ischemia.  EDP discussed the case with cardiology, Dr. Welton Flakes, Jeremy recommended admission for observation for possible stress test tomorrow.  At the time of this visit the patient's chest pain was mild 2/10.  Admitted by [facility], hospitalist service.  ED Course: Temperature 98.5.  BP 138/94, pulse 65, respiratory rate 16, saturation 96% on room air.  Lab studies remarkable for creatinine 1.34 with GFR 56.  CBC essentially unremarkable.  Review of Systems: Review of systems as noted in the HPI. All other systems reviewed and are negative.   Past Medical History:  Diagnosis Date   Colon polyps    IBS (irritable bowel syndrome)    Nephrolithiasis    Past Surgical History:  Procedure Laterality Date   KNEE ARTHROSCOPY      Social History:  reports that he has never smoked. He has  never used smokeless tobacco. He reports that he does not drink alcohol and does not use drugs.   No Known Allergies  Family History  Problem Relation Age of Onset   Alcohol abuse Father        cirrhosis   COPD Mother    Colon cancer Neg Hx    Stomach cancer Neg Hx       Prior to Admission medications   Medication Sig Start Date End Date Taking? Authorizing Provider  Jeremy Kerr (ACIPHEX) 20 MG tablet Take 1 tablet (20 mg total) by mouth daily. 02/29/12 02/28/13  Mardella Layman, MD    Physical Exam: BP (!) 138/94   Pulse (!) 55   Temp 98.5 F (36.9 C) (Oral)   Resp 18   SpO2 96%   General: 72 y.o. year-old Kerr well developed well nourished in no acute distress.  Alert and oriented x3. Cardiovascular: Kerr with no rubs or gallops.  No thyromegaly or JVD noted.  No lower extremity edema. 2/4 pulses in all 4 extremities. Respiratory: Clear to auscultation with no wheezes or rales. Good inspiratory effort. Abdomen: Obese non tender with normal bowel sounds x4 quadrants. Muskuloskeletal: No cyanosis, clubbing or edema noted bilaterally Neuro: CN II-XII intact, strength, sensation, reflexes Skin: No ulcerative lesions noted or rashes Psychiatry: Judgement and insight appear normal. Mood is appropriate for condition and setting          Labs on Admission:  Basic Metabolic  Panel: Recent Labs  Lab 03/19/23 1434  NA 140  K 4.5  CL 108  CO2 25  GLUCOSE 105*  BUN 18  CREATININE 1.34*  CALCIUM 9.6   Liver Function Tests: No results for input(s): "AST", "ALT", "ALKPHOS", "BILITOT", "PROT", "ALBUMIN" in the last 168 hours. No results for input(s): "LIPASE", "AMYLASE" in the last 168 hours. No results for input(s): "AMMONIA" in the last 168 hours. CBC: Recent Labs  Lab 03/19/23 1434  WBC 6.8  HGB 15.6  HCT 48.5  MCV 93.6  PLT 212   Cardiac Enzymes: No results for input(s): "CKTOTAL", "CKMB", "CKMBINDEX", "TROPONINI" in the last 168 hours.  BNP (last 3  results) No results for input(s): "BNP" in the last 8760 hours.  ProBNP (last 3 results) No results for input(s): "PROBNP" in the last 8760 hours.  CBG: No results for input(s): "GLUCAP" in the last 168 hours.  Radiological Exams on Admission: DG Chest 2 View  Result Date: 03/19/2023 CLINICAL DATA:  Chest pain EXAM: CHEST - 2 VIEW COMPARISON:  X-ray 02/21/2023 FINDINGS: No consolidation, pneumothorax or effusion. No edema. Normal cardiopericardial silhouette. Degenerative changes are seen along the spine. IMPRESSION: No acute cardiopulmonary disease. Electronically Signed   By: Karen Kays M.D.   On: 03/19/2023 15:05    EKG: I independently viewed the EKG done and my findings are as followed: Sinus Kerr with Jeremy Kerr.  Rate of 52.  QTc 390.  Assessment/Plan Present on Admission:  Chest pain  Principal Problem:   Chest pain  Atypical chest pain, rule out ACS Exertional and nonexertional chest pain Prior to this visit the patient had received 3 baby aspirin and received 1 baby aspirin in the ED. High-sensitivity troponin negative x 2 12 lead EKG with no evidence of acute ischemia Obtain 2D echo and fasting lipid panel Cardiology recommended admission for possible stress test on 03/20/2023 Monitor on telemetry  Jeremy Kerr Asymptomatic-not on any AV nodal blockade agents. 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  Jeremy Kerr Resume home PPI  Overweight Weight 97 kg with BMI of 28. Recommend weight loss outpatient with regular physical activity and healthy dieting.   DVT prophylaxis: Subcu Lovenox daily  Code Status: Full code  Family Communication: Updated his wife at bedside  Disposition Plan: Admitted to telemetry cardiac unit  Consults  called: Cardiology consulted by EDP  Admission status: Observation status   Status is: Observation    Darlin Drop MD [facility] Pager [phone]  If 7PM-7AM, please contact night-coverage [email or web address] Password [facility]  03/19/2023, 7:39 PM

## 2023-03-19 NOTE — ED Triage Notes (Signed)
Pt here POV with reports of L sided chest pain, worse with exertion. States he has had this pain before but it usually goes away. Now any exertion brings the pain on worse than before.

## 2023-03-20 ENCOUNTER — Observation Stay (HOSPITAL_BASED_OUTPATIENT_CLINIC_OR_DEPARTMENT_OTHER): Payer: No Typology Code available for payment source

## 2023-03-20 ENCOUNTER — Encounter (HOSPITAL_COMMUNITY)
Admission: EM | Disposition: A | Discharge status: Home / Self Care | Payer: Self-pay | Source: Home / Self Care | Attending: Emergency Medicine

## 2023-03-20 DIAGNOSIS — R072 Precordial pain: Secondary | ICD-10-CM | POA: Diagnosis not present

## 2023-03-20 DIAGNOSIS — R079 Chest pain, unspecified: Secondary | ICD-10-CM

## 2023-03-20 DIAGNOSIS — I200 Unstable angina: Secondary | ICD-10-CM | POA: Diagnosis not present

## 2023-03-20 HISTORY — PX: RIGHT/LEFT HEART CATH AND CORONARY ANGIOGRAPHY: CATH118266

## 2023-03-20 LAB — BASIC METABOLIC PANEL
Anion gap: 7 (ref 5–15)
BUN: 18 mg/dL (ref 8–23)
CO2: 25 mmol/L (ref 22–32)
Calcium: 9.1 mg/dL (ref 8.9–10.3)
Chloride: 106 mmol/L (ref 98–111)
Creatinine, Ser: 1.27 mg/dL — ABNORMAL HIGH (ref 0.61–1.24)
GFR, Estimated: 60 mL/min — ABNORMAL LOW (ref >60)
Glucose, Bld: 101 mg/dL — ABNORMAL HIGH (ref 70–99)
Potassium: 3.8 mmol/L (ref 3.5–5.1)
Sodium: 138 mmol/L (ref 135–145)

## 2023-03-20 LAB — POCT I-STAT 7, (LYTES, BLD GAS, ICA,H+H)
Acid-Base Excess: 0 mmol/L (ref 0.0–2.0)
Bicarbonate: 24.8 mmol/L (ref 20.0–28.0)
Calcium, Ion: 1.17 mmol/L (ref 1.15–1.40)
HCT: 45 % (ref 39.0–52.0)
Hemoglobin: 15.3 g/dL (ref 13.0–17.0)
O2 Saturation: 94 %
Potassium: 3.9 mmol/L (ref 3.5–5.1)
Sodium: 141 mmol/L (ref 135–145)
TCO2: 26 mmol/L (ref 22–32)
pCO2 arterial: 38.8 mmHg (ref 32–48)
pH, Arterial: 7.415 (ref 7.35–7.45)
pO2, Arterial: 69 mmHg — ABNORMAL LOW (ref 83–108)

## 2023-03-20 LAB — POCT I-STAT EG7
Acid-base deficit: 1 mmol/L (ref 0.0–2.0)
Acid-base deficit: 1 mmol/L (ref 0.0–2.0)
Bicarbonate: 24.4 mmol/L (ref 20.0–28.0)
Bicarbonate: 25 mmol/L (ref 20.0–28.0)
Calcium, Ion: 1.15 mmol/L (ref 1.15–1.40)
Calcium, Ion: 1.16 mmol/L (ref 1.15–1.40)
HCT: 45 % (ref 39.0–52.0)
HCT: 45 % (ref 39.0–52.0)
Hemoglobin: 15.3 g/dL (ref 13.0–17.0)
Hemoglobin: 15.3 g/dL (ref 13.0–17.0)
O2 Saturation: 69 %
O2 Saturation: 69 %
Potassium: 3.6 mmol/L (ref 3.5–5.1)
Potassium: 3.8 mmol/L (ref 3.5–5.1)
Sodium: 141 mmol/L (ref 135–145)
Sodium: 142 mmol/L (ref 135–145)
TCO2: 26 mmol/L (ref 22–32)
TCO2: 26 mmol/L (ref 22–32)
pCO2, Ven: 43.7 mmHg — ABNORMAL LOW (ref 44–60)
pCO2, Ven: 44.5 mmHg (ref 44–60)
pH, Ven: 7.355 (ref 7.25–7.43)
pH, Ven: 7.357 (ref 7.25–7.43)
pO2, Ven: 38 mmHg (ref 32–45)
pO2, Ven: 38 mmHg (ref 32–45)

## 2023-03-20 LAB — ECHOCARDIOGRAM COMPLETE
AR max vel: 3.26 cm2
AV Area VTI: 3.15 cm2
AV Area mean vel: 3.07 cm2
AV Mean grad: 2.5 mmHg
AV Peak grad: 5 mmHg
Ao pk vel: 1.12 m/s
Area-P 1/2: 2.45 cm2
Calc EF: 51.1 %
Height: 74 in
MV VTI: 3.27 cm2
S' Lateral: 3.2 cm
Single Plane A2C EF: 51.2 %
Single Plane A4C EF: 52.3 %
Weight: 3536 oz

## 2023-03-20 LAB — CBC
HCT: 46.3 % (ref 39.0–52.0)
Hemoglobin: 15.1 g/dL (ref 13.0–17.0)
MCH: 30.8 pg (ref 26.0–34.0)
MCHC: 32.6 g/dL (ref 30.0–36.0)
MCV: 94.5 fL (ref 80.0–100.0)
Platelets: 194 10*3/uL (ref 150–400)
RBC: 4.9 MIL/uL (ref 4.22–5.81)
RDW: 12.5 % (ref 11.5–15.5)
WBC: 5.7 10*3/uL (ref 4.0–10.5)
nRBC: 0 % (ref 0.0–0.2)

## 2023-03-20 LAB — LIPID PANEL
Cholesterol: 183 mg/dL (ref 0–200)
HDL: 48 mg/dL (ref >40)
LDL Cholesterol: 116 mg/dL — ABNORMAL HIGH (ref 0–99)
Total CHOL/HDL Ratio: 3.8 RATIO
Triglycerides: 93 mg/dL (ref <150)
VLDL: 19 mg/dL (ref 0–40)

## 2023-03-20 LAB — TSH: TSH: 2.122 u[IU]/mL (ref 0.350–4.500)

## 2023-03-20 LAB — MAGNESIUM: Magnesium: 2.2 mg/dL (ref 1.7–2.4)

## 2023-03-20 SURGERY — RIGHT/LEFT HEART CATH AND CORONARY ANGIOGRAPHY
Anesthesia: LOCAL

## 2023-03-20 MED ORDER — LIDOCAINE HCL (PF) 1 % IJ SOLN
INTRAMUSCULAR | Status: AC
  Filled 2023-03-20: qty 30

## 2023-03-20 MED ORDER — FENTANYL CITRATE (PF) 100 MCG/2ML IJ SOLN
INTRAMUSCULAR | Status: AC
  Filled 2023-03-20: qty 2

## 2023-03-20 MED ORDER — FENTANYL CITRATE (PF) 100 MCG/2ML IJ SOLN
INTRAMUSCULAR | Status: DC | PRN
  Administered 2023-03-20: 25 ug via INTRAVENOUS

## 2023-03-20 MED ORDER — ACETAMINOPHEN 325 MG PO TABS: 650.0000 mg | ORAL_TABLET | ORAL | Status: DC | PRN

## 2023-03-20 MED ORDER — HEPARIN SODIUM (PORCINE) 1000 UNIT/ML IJ SOLN
INTRAMUSCULAR | Status: DC | PRN
  Administered 2023-03-20: 5000 [IU] via INTRAVENOUS

## 2023-03-20 MED ORDER — HEPARIN (PORCINE) IN NACL 1000-0.9 UT/500ML-% IV SOLN
INTRAVENOUS | Status: DC | PRN
  Administered 2023-03-20 (×2): 500 mL

## 2023-03-20 MED ORDER — SODIUM CHLORIDE 0.9% FLUSH
3.0000 mL | Freq: Two times a day (BID) | INTRAVENOUS | Status: DC
  Administered 2023-03-20 – 2023-03-21 (×2): 3 mL via INTRAVENOUS

## 2023-03-20 MED ORDER — HYDRALAZINE HCL 20 MG/ML IJ SOLN: 10.0000 mg | INTRAMUSCULAR | Status: AC | PRN

## 2023-03-20 MED ORDER — SODIUM CHLORIDE 0.9% FLUSH: 3.0000 mL | INTRAVENOUS | Status: DC | PRN

## 2023-03-20 MED ORDER — SODIUM CHLORIDE 0.9 % IV SOLN: INTRAVENOUS | Status: DC

## 2023-03-20 MED ORDER — ONDANSETRON HCL 4 MG/2ML IJ SOLN: 4.0000 mg | Freq: Four times a day (QID) | INTRAMUSCULAR | Status: DC | PRN

## 2023-03-20 MED ORDER — VERAPAMIL HCL 2.5 MG/ML IV SOLN
INTRAVENOUS | Status: AC
  Filled 2023-03-20: qty 2

## 2023-03-20 MED ORDER — IOHEXOL 350 MG/ML SOLN
INTRAVENOUS | Status: DC | PRN
  Administered 2023-03-20: 35 mL

## 2023-03-20 MED ORDER — MIDAZOLAM HCL 2 MG/2ML IJ SOLN
INTRAMUSCULAR | Status: DC | PRN
  Administered 2023-03-20: 1 mg via INTRAVENOUS

## 2023-03-20 MED ORDER — SODIUM CHLORIDE 0.9% FLUSH
3.0000 mL | Freq: Two times a day (BID) | INTRAVENOUS | Status: DC
  Administered 2023-03-21: 3 mL via INTRAVENOUS

## 2023-03-20 MED ORDER — SODIUM CHLORIDE 0.9 % IV SOLN: 250.0000 mL | INTRAVENOUS | Status: DC | PRN

## 2023-03-20 MED ORDER — MORPHINE SULFATE (PF) 2 MG/ML IV SOLN: 2.0000 mg | INTRAVENOUS | Status: DC | PRN

## 2023-03-20 MED ORDER — VERAPAMIL HCL 2.5 MG/ML IV SOLN
INTRA_ARTERIAL | Status: DC | PRN
  Administered 2023-03-20: 5 mL via INTRA_ARTERIAL

## 2023-03-20 MED ORDER — LABETALOL HCL 5 MG/ML IV SOLN: 10.0000 mg | INTRAVENOUS | Status: AC | PRN

## 2023-03-20 MED ORDER — SODIUM CHLORIDE 0.9 % IV SOLN: INTRAVENOUS | Status: AC

## 2023-03-20 MED ORDER — LIDOCAINE HCL (PF) 1 % IJ SOLN
INTRAMUSCULAR | Status: DC | PRN
  Administered 2023-03-20 (×2): 2 mL

## 2023-03-20 MED ORDER — NITROGLYCERIN 1 MG/10 ML FOR IR/CATH LAB
INTRA_ARTERIAL | Status: AC
  Filled 2023-03-20: qty 10

## 2023-03-20 MED ORDER — MIDAZOLAM HCL 2 MG/2ML IJ SOLN
INTRAMUSCULAR | Status: AC
  Filled 2023-03-20: qty 2

## 2023-03-20 MED ORDER — ASPIRIN 81 MG PO CHEW
81.0000 mg | CHEWABLE_TABLET | ORAL | Status: AC
  Administered 2023-03-20: 81 mg via ORAL
  Filled 2023-03-20: qty 1

## 2023-03-20 MED ORDER — NITROGLYCERIN 0.4 MG SL SUBL: 0.4000 mg | SUBLINGUAL_TABLET | SUBLINGUAL | Status: DC | PRN

## 2023-03-20 MED ORDER — HEPARIN SODIUM (PORCINE) 1000 UNIT/ML IJ SOLN
INTRAMUSCULAR | Status: AC
  Filled 2023-03-20: qty 10

## 2023-03-20 SURGICAL SUPPLY — 14 items
BAND CMPR LRG ZPHR (HEMOSTASIS) ×1
BAND ZEPHYR COMPRESS 30 LONG (HEMOSTASIS) IMPLANT
CATH BALLN WEDGE 5F 110CM (CATHETERS) IMPLANT
CATH INFINITI JR4 5F (CATHETERS) IMPLANT
CATH OPTITORQUE TIG 4.0 5F (CATHETERS) IMPLANT
GLIDESHEATH SLEND A-KIT 6F 22G (SHEATH) IMPLANT
GUIDEWIRE INQWIRE 1.5J.035X260 (WIRE) IMPLANT
INQWIRE 1.5J .035X260CM (WIRE) ×1
KIT HEART LEFT (KITS) ×2 IMPLANT
PACK CARDIAC CATHETERIZATION (CUSTOM PROCEDURE TRAY) ×2 IMPLANT
SHEATH GLIDE SLENDER 4/5FR (SHEATH) IMPLANT
TRANSDUCER W/STOPCOCK (MISCELLANEOUS) ×2 IMPLANT
TUBING CIL FLEX 10 FLL-RA (TUBING) ×2 IMPLANT
WIRE HI TORQ VERSACORE-J 145CM (WIRE) IMPLANT

## 2023-03-20 NOTE — Interval H&P Note (Signed)
Cath Lab Visit (complete for each Cath Lab visit)  Clinical Evaluation Leading to the Procedure:   ACS: Yes.    Non-ACS:    Anginal Classification: CCS II  Anti-ischemic medical therapy: No Therapy  Non-Invasive Test Results: No non-invasive testing performed  Prior CABG: No previous CABG      History and Physical Interval Note:  03/20/2023 3:20 PM  Jeremy Kerr  has presented today for surgery, with the diagnosis of unstable angina.  The various methods of treatment have been discussed with the patient and family. After consideration of risks, benefits and other options for treatment, the patient has consented to  Procedure(s): RIGHT/LEFT HEART CATH AND CORONARY ANGIOGRAPHY (N/A) as a surgical intervention.  The patient's history has been reviewed, patient examined, no change in status, stable for surgery.  I have reviewed the patient's chart and labs.  Questions were answered to the patient's satisfaction.     [name]

## 2023-03-20 NOTE — Consult Note (Signed)
Cardiology Consultation   Patient ID: ELIZAH MIERZWA MRN: [id number]; DOB: Sep 24, 1951  Admit date: 03/19/2023 Date of Consult: 03/20/2023  PCP:  [facility]   [facility] Providers Cardiologist:  None        Patient Profile:   Jeremy Kerr is a 72 y.o. male with a hx of obesity, GERD, first-degree AV block, chronic sinus bradycardia  who is being seen 03/20/2023 for the evaluation of chest pain at the request of Dr. Margo Aye  History of Present Illness:   Jeremy Kerr is a 72 y.o. male with a hx of obesity, GERD, first-degree AV block, chronic sinus bradycardia  who is being seen 03/20/2023 for the evaluation of chest pain at the request of Dr. Margo Aye. Patient has been having on and off chest pain since sometime. The patient presented 3 weeks ago at [facility] for the same.  He was offered admission however he declined, wanted to follow-up with the [facility]. He never got any testing done at the time. Now he has been having more and more CP with exertion and lasting longer time hence came to the ED. Denies any SOB or N/V. CP can be non-exertional as well. Slightly worsened by deep breathing. In the ED, EKG showed sinus brady with 1st degree AV block. Troponin flat. Cardiology was consulted for chest pain pain.    Past Medical History:  Diagnosis Date   Colon polyps    IBS (irritable bowel syndrome)    Nephrolithiasis     Past Surgical History:  Procedure Laterality Date   KNEE ARTHROSCOPY         Inpatient Medications: Scheduled Meds:  enoxaparin (LOVENOX) injection  40 mg Subcutaneous Q24H   pantoprazole  40 mg Oral Daily   Continuous Infusions:  sodium chloride 50 mL/hr at 03/19/23 2200   PRN Meds:   Allergies:   No Known Allergies  Social History:   Social History   Socioeconomic History   Marital status: Married    Spouse name: Not on file   Number of children: Not on file   Years of education: Not on file   Highest education level: Not on file   Occupational History   Not on file  Tobacco Use   Smoking status: Never   Smokeless tobacco: Never  Substance and Sexual Activity   Alcohol use: No   Drug use: No   Sexual activity: Not on file  Other Topics Concern   Not on file  Social History Narrative   Not on file   Social Determinants of Health   Financial Resource Strain: Not on file  Food Insecurity: Not on file  Transportation Needs: Not on file  Physical Activity: Not on file  Stress: Not on file  Social Connections: Not on file  Intimate Partner Violence: Not on file    Family History:   Family History  Problem Relation Age of Onset   Alcohol abuse Father        cirrhosis   COPD Mother    Colon cancer Neg Hx    Stomach cancer Neg Hx      ROS:  Please see the history of present illness. All other ROS reviewed and negative.     Physical Exam/Data:   Vitals:   03/19/23 1932 03/19/23 2000 03/19/23 2130 03/19/23 2200  BP:  102/60 124/74 (!) 144/89  Pulse:  (!) 57 63 (!) 52  Resp:  13 13 12   Temp: 98.5 F (36.9 C)  97.9  F (36.6 C) 98 F (36.7 C)  TempSrc: Oral  Oral Oral  SpO2:  97% 95% 100%  Weight:    100.2 kg  Height:    6\' 2"  (1.88 m)    Intake/Output Summary (Last 24 hours) at 03/20/2023 0003 Last data filed at 03/19/2023 2200 Gross per 24 hour  Intake 319.15 ml  Output --  Net 319.15 ml      03/19/2023   10:00 PM 02/21/2023   11:11 PM 05/09/2019    4:41 AM  Last 3 Weights  Weight (lbs) 221 lb 214 lb 210 lb  Weight (kg) 100.245 kg 97.07 kg 95.255 kg     Body mass index is 28.37 kg/m.  General:  Well nourished, well developed, in no acute distress HEENT: normal Neck: no JVD Vascular: No carotid bruits; Distal pulses 2+ bilaterally Cardiac:  normal S1, S2; RRR; no murmur  Lungs:  clear to auscultation bilaterally, no wheezing, rhonchi or rales  Abd: soft, nontender, no hepatomegaly  Ext: no edema Musculoskeletal:  No deformities, BUE and BLE strength normal and equal Skin: warm and  dry  Neuro:  CNs 2-12 intact, no focal abnormalities noted Psych:  Normal affect   EKG:  The EKG was personally reviewed and demonstrates sinus brady with 1st degree AV block   Relevant CV Studies:  None  Laboratory Data:  High Sensitivity Troponin:   Recent Labs  Lab 02/21/23 2312 02/22/23 0114 03/19/23 1434 03/19/23 1700  TROPONINIHS 10 13 9 8      Chemistry Recent Labs  Lab 03/19/23 1434 03/19/23 2025  NA 140  --   K 4.5  --   CL 108  --   CO2 25  --   GLUCOSE 105*  --   BUN 18  --   CREATININE 1.34* 1.31*  CALCIUM 9.6  --   GFRNONAA 56* 57*  ANIONGAP 7  --     No results for input(s): "PROT", "ALBUMIN", "AST", "ALT", "ALKPHOS", "BILITOT" in the last 168 hours. Lipids No results for input(s): "CHOL", "TRIG", "HDL", "LABVLDL", "LDLCALC", "CHOLHDL" in the last 168 hours.  Hematology Recent Labs  Lab 03/19/23 1434 03/19/23 2025  WBC 6.8 7.6  RBC 5.18 5.27  HGB 15.6 16.5  HCT 48.5 50.3  MCV 93.6 95.4  MCH 30.1 31.3  MCHC 32.2 32.8  RDW 12.5 12.6  PLT 212 212   Thyroid No results for input(s): "TSH", "FREET4" in the last 168 hours.  BNPNo results for input(s): "BNP", "PROBNP" in the last 168 hours.  DDimer No results for input(s): "DDIMER" in the last 168 hours.   Radiology/Studies:  DG Chest 2 View  Result Date: 03/19/2023 CLINICAL DATA:  Chest pain EXAM: CHEST - 2 VIEW COMPARISON:  X-ray 02/21/2023 FINDINGS: No consolidation, pneumothorax or effusion. No edema. Normal cardiopericardial silhouette. Degenerative changes are seen along the spine. IMPRESSION: No acute cardiopulmonary disease. Electronically Signed   By: Karen Kays M.D.   On: 03/19/2023 15:05     Assessment and Plan:   # Chest Pain Concerning for Angina  -EKG and Troponin reassuring -However with age and story, concern for angina -Stress test in AM -NPO at midnight -Telemetry -Check Lipid profile and A1C -Echo in AM -Trend Cr- agree with IV hydration.   For questions or  updates, please contact [facility] Please consult [email or web address] for contact info under    Signed, Hermelinda Dellen, MD  03/20/2023 12:03 AM

## 2023-03-20 NOTE — Progress Notes (Signed)
 Rounding Note    Patient Name: Jeremy Kerr Date of Encounter: 03/20/2023    Subjective   No chest or shoulder/arm pain   Mild nausea   Breathing is OK  Inpatient Medications    Scheduled Meds:  enoxaparin (LOVENOX) injection  40 mg Subcutaneous Q24H   pantoprazole  40 mg Oral Daily   Continuous Infusions:  sodium chloride 50 mL/hr at 03/20/23 0600   PRN Meds: nitroGLYCERIN   Vital Signs    Vitals:   03/19/23 2130 03/19/23 2200 03/20/23 0040 03/20/23 0359  BP: 124/74 (!) 144/89 (!) 141/97 124/75  Pulse: 63 (!) 52 61 60  Resp: 13 12 19 19  Temp: 97.9 F (36.6 C) 98 F (36.7 C) 98 F (36.7 C) 97.9 F (36.6 C)  TempSrc: Oral Oral Oral Oral  SpO2: 95% 100% 95% 96%  Weight:  100.2 kg    Height:  6' 2" (1.88 m)      Intake/Output Summary (Last 24 hours) at 03/20/2023 0752 Last data filed at 03/20/2023 0600 Gross per 24 hour  Intake 716.05 ml  Output --  Net 716.05 ml      03/19/2023   10:00 PM 02/21/2023   11:11 PM 05/09/2019    4:41 AM  Last 3 Weights  Weight (lbs) 221 lb 214 lb 210 lb  Weight (kg) 100.245 kg 97.07 kg 95.255 kg      Telemetry    SB/SR  - Personally Reviewed  ECG    No new  - Personally Reviewed  Physical Exam   GEN: No acute distress.   Neck: JVP is normal  Cardiac: RRR, no murmur   Respiratory: Clear to auscultation  GI: Soft, nontender, non-distended  MS: No edema; No deformity.  No pain with movement of arms Neuro:  Nonfocal  Psych: Normal affect   Labs    High Sensitivity Troponin:   Recent Labs  Lab 02/21/23 2312 02/22/23 0114 03/19/23 1434 03/19/23 1700  TROPONINIHS 10 13 9 8     Chemistry Recent Labs  Lab 03/19/23 1434 03/19/23 2025 03/20/23 0323  NA 140  --  138  K 4.5  --  3.8  CL 108  --  106  CO2 25  --  25  GLUCOSE 105*  --  101*  BUN 18  --  18  CREATININE 1.34* 1.31* 1.27*  CALCIUM 9.6  --  9.1  MG  --   --  2.2  GFRNONAA 56* 57* 60*  ANIONGAP 7  --  7    Lipids  Recent Labs  Lab  03/20/23 0323  CHOL 183  TRIG 93  HDL 48  LDLCALC 116*  CHOLHDL 3.8    Hematology Recent Labs  Lab 03/19/23 1434 03/19/23 2025 03/20/23 0323  WBC 6.8 7.6 5.7  RBC 5.18 5.27 4.90  HGB 15.6 16.5 15.1  HCT 48.5 50.3 46.3  MCV 93.6 95.4 94.5  MCH 30.1 31.3 30.8  MCHC 32.2 32.8 32.6  RDW 12.5 12.6 12.5  PLT 212 212 194   Thyroid  Recent Labs  Lab 03/20/23 0323  TSH 2.122    BNPNo results for input(s): "BNP", "PROBNP" in the last 168 hours.  DDimer No results for input(s): "DDIMER" in the last 168 hours.   Radiology    DG Chest 2 View  Result Date: 03/19/2023 CLINICAL DATA:  Chest pain EXAM: CHEST - 2 VIEW COMPARISON:  X-ray 02/21/2023 FINDINGS: No consolidation, pneumothorax or effusion. No edema. Normal cardiopericardial silhouette. Degenerative changes are seen   along the spine. IMPRESSION: No acute cardiopulmonary disease. Electronically Signed   By: Ashok  Gupta M.D.   On: 03/19/2023 15:05    Cardiac Studies     Patient Profile     Rozell J Weida is a 72 y.o. male with a hx of obesity, GERD, first-degree AV block, chronic sinus bradycardia  who is being seen 03/20/2023 for the evaluation of chest pain at the request of Dr. Hall   Assessment & Plan    1  Chest pain  Pt has had several episodes of chest / arm pain over the past few weeks   was seen in ER on 5/15    Declined admission.   [facility]   Stress test ordered    He does not want to do   Says he could have a heart attack. Now admitted with episode of severe CP/ arm pain with minimal exertion. Note that patient and wife say that he gets more SOB with activity over the past several weeks    Concerning history for [place]   I discussed with patient modes of testing   I would no recomm stress testing     I would recomm R/L heart cath to define anatomy/pressures.  Risks/benefits described   Pt understands and agrees to proceed      Continue IV hydration     2 Bradycardia    No significant bradycardia on tele   Continue to follow   3  LIpids   LDL 116  HDL 48    Trig 93  Total chol 183   Goals to be defined by cath findings     For questions or updates, please contact [facility] Please consult [email or web address] for contact info under        Signed, Aneeka Bowden, MD  03/20/2023, 7:52 AM         

## 2023-03-20 NOTE — Plan of Care (Signed)
  Problem: Education: Goal: Knowledge of General Education information will improve Description: Including pain rating scale, medication(s)/side effects and non-pharmacologic comfort measures Outcome: Progressing   Problem: Health Behavior/Discharge Planning: Goal: Ability to manage health-related needs will improve Outcome: Progressing   Problem: Pain Managment: Goal: General experience of comfort will improve Outcome: Progressing   Problem: Safety: Goal: Ability to remain free from injury will improve Outcome: Progressing   Problem: Cardiovascular: Goal: Ability to achieve and maintain adequate cardiovascular perfusion will improve Outcome: Progressing Goal: Vascular access site(s) Level 0-1 will be maintained Outcome: Progressing   Problem: Health Behavior/Discharge Planning: Goal: Ability to safely manage health-related needs after discharge will improve Outcome: Progressing

## 2023-03-20 NOTE — Discharge Instructions (Signed)
Call [facility] [address] at [phone] if any bleeding, swelling or drainage at cath site.  May shower, no tub baths for 48 hours for groin sticks. No lifting over 5 pounds for 3 days.  No Driving for 3 days ° °Heart Healthy Diet  °

## 2023-03-20 NOTE — H&P (View-Only) (Signed)
Rounding Note    Patient Name: Jeremy Kerr Date of Encounter: 03/20/2023    Subjective   No chest or shoulder/arm pain   Mild nausea   Breathing is OK  Inpatient Medications    Scheduled Meds:  enoxaparin (LOVENOX) injection  40 mg Subcutaneous Q24H   pantoprazole  40 mg Oral Daily   Continuous Infusions:  sodium chloride 50 mL/hr at 03/20/23 0600   PRN Meds: nitroGLYCERIN   Vital Signs    Vitals:   03/19/23 2130 03/19/23 2200 03/20/23 0040 03/20/23 0359  BP: 124/74 (!) 144/89 (!) 141/97 124/75  Pulse: 63 (!) 52 61 60  Resp: 13 12 19 19   Temp: 97.9 F (36.6 C) 98 F (36.7 C) 98 F (36.7 C) 97.9 F (36.6 C)  TempSrc: Oral Oral Oral Oral  SpO2: 95% 100% 95% 96%  Weight:  100.2 kg    Height:  6\' 2"  (1.88 m)      Intake/Output Summary (Last 24 hours) at 03/20/2023 0752 Last data filed at 03/20/2023 0600 Gross per 24 hour  Intake 716.05 ml  Output --  Net 716.05 ml      03/19/2023   10:00 PM 02/21/2023   11:11 PM 05/09/2019    4:41 AM  Last 3 Weights  Weight (lbs) 221 lb 214 lb 210 lb  Weight (kg) 100.245 kg 97.07 kg 95.255 kg      Telemetry    SB/SR  - Personally Reviewed  ECG    No new  - Personally Reviewed  Physical Exam   GEN: No acute distress.   Neck: JVP is normal  Cardiac: RRR, no murmur   Respiratory: Clear to auscultation  GI: Soft, nontender, non-distended  MS: No edema; No deformity.  No pain with movement of arms Neuro:  Nonfocal  Psych: Normal affect   Labs    High Sensitivity Troponin:   Recent Labs  Lab 02/21/23 2312 02/22/23 0114 03/19/23 1434 03/19/23 1700  TROPONINIHS 10 13 9 8      Chemistry Recent Labs  Lab 03/19/23 1434 03/19/23 2025 03/20/23 0323  NA 140  --  138  K 4.5  --  3.8  CL 108  --  106  CO2 25  --  25  GLUCOSE 105*  --  101*  BUN 18  --  18  CREATININE 1.34* 1.31* 1.27*  CALCIUM 9.6  --  9.1  MG  --   --  2.2  GFRNONAA 56* 57* 60*  ANIONGAP 7  --  7    Lipids  Recent Labs  Lab  03/20/23 0323  CHOL 183  TRIG 93  HDL 48  LDLCALC 116*  CHOLHDL 3.8    Hematology Recent Labs  Lab 03/19/23 1434 03/19/23 2025 03/20/23 0323  WBC 6.8 7.6 5.7  RBC 5.18 5.27 4.90  HGB 15.6 16.5 15.1  HCT 48.5 50.3 46.3  MCV 93.6 95.4 94.5  MCH 30.1 31.3 30.8  MCHC 32.2 32.8 32.6  RDW 12.5 12.6 12.5  PLT 212 212 194   Thyroid  Recent Labs  Lab 03/20/23 0323  TSH 2.122    BNPNo results for input(s): "BNP", "PROBNP" in the last 168 hours.  DDimer No results for input(s): "DDIMER" in the last 168 hours.   Radiology    DG Chest 2 View  Result Date: 03/19/2023 CLINICAL DATA:  Chest pain EXAM: CHEST - 2 VIEW COMPARISON:  X-ray 02/21/2023 FINDINGS: No consolidation, pneumothorax or effusion. No edema. Normal cardiopericardial silhouette. Degenerative changes are seen  along the spine. IMPRESSION: No acute cardiopulmonary disease. Electronically Signed   By: Karen Kays M.D.   On: 03/19/2023 15:05    Cardiac Studies     Patient Profile     Jeremy Kerr is a 72 y.o. male with a hx of obesity, GERD, first-degree AV block, chronic sinus bradycardia  who is being seen 03/20/2023 for the evaluation of chest pain at the request of Dr. Margo Aye   Assessment & Plan    1  Chest pain  Pt has had several episodes of chest / arm pain over the past few weeks   was seen in ER on 5/15    Declined admission.   Also seen at [facility]   Stress test ordered    He does not want to do   Says he could have a heart attack. Now admitted with episode of severe CP/ arm pain with minimal exertion. Note that patient and wife say that he gets more SOB with activity over the past several weeks    Concerning history for [place]   I discussed with patient modes of testing   I would no recomm stress testing     I would recomm R/L heart cath to define anatomy/pressures.  Risks/benefits described   Pt understands and agrees to proceed      Continue IV hydration     2 Bradycardia    No significant bradycardia on tele   Continue to follow   3  LIpids   LDL 116  HDL 48    Trig 93  Total chol 183   Goals to be defined by cath findings     For questions or updates, please contact [facility] Please consult [email or web address] for contact info under        Signed, Dietrich Pates, MD  03/20/2023, 7:52 AM

## 2023-03-20 NOTE — Progress Notes (Signed)
PROGRESS NOTE    Jeremy Kerr  ZOX:[id number] DOB: 05/25/1951 DOA: 03/19/2023 PCP: [facility]  Outpatient Specialists:     Brief Narrative:  Patient is a 72 year old male with past medical history significant for obesity, GERD, first-degree AV block and chronic sinus bradycardia.  Patient tells me that he has had sharp chest pain for about 6 to 8 weeks.  Chest pain has become more severe, with radiation to left upper extremity, and associated with shortness of breath and diaphoresis.  Troponin is negative.  Cardiology input is appreciated.  Cardiac catheterization is planned.   Assessment & Plan:   Principal Problem:   Chest pain Active Problems:   Unstable angina (HCC)   Chest pain, rule out ACS -Patient reported sharp chest pain, intermittent, and has been going on for the last 6 to 8 weeks. -Troponin is negative x 2. -EKG reveals poor R wave progression. -Cardiology team is directing care. -For cardiac catheterization today. -Further management will depend on hospital course.   Chronic sinus bradycardia Asymptomatic-not on any AV nodal blockade agents. TSH 2.122.  Normal. Monitor on telemetry   Chronic kidney disease stage II/IIIa: -Stable. -Monitor closely for contrast associated nephropathy after cardiac catheterization. -Gentle hydration.  GERD Resume home PPI   Overweight Weight 97 kg with BMI of 28.   DVT prophylaxis:  Code Status: Full code. Family Communication:  Disposition Plan: Home eventually   Consultants:  Cardiology  Procedures:  Cardiac catheterization is planned  Antimicrobials:  None   Subjective: Chest pain.  Objective: Vitals:   03/20/23 1546 03/20/23 1551 03/20/23 1556 03/20/23 1610  BP: (!) 141/85 104/80 113/78 119/81  Pulse: 62 (!) 59 (!) 48 (!) 48  Resp: 12 13 12 14   Temp:    98.1 F (36.7 C)  TempSrc:    Oral  SpO2: 94% 93% 95% 99%  Weight:      Height:        Intake/Output Summary (Last 24 hours)  at 03/20/2023 1820 Last data filed at 03/20/2023 0600 Gross per 24 hour  Intake 716.05 ml  Output --  Net 716.05 ml   Filed Weights   03/19/23 2200  Weight: 100.2 kg    Examination:  General exam: Appears calm and comfortable.  Patient is pale. Respiratory system: Clear to auscultation.  Cardiovascular system: S1 & S2 heard. Gastrointestinal system: Abdomen is soft and nontender.  Central nervous system: Awake and alert.   Extremities: No leg edema.  Data Reviewed: I have personally reviewed following labs and imaging studies  CBC: Recent Labs  Lab 03/19/23 1434 03/19/23 2025 03/20/23 0323 03/20/23 1545 03/20/23 1549 03/20/23 1550  WBC 6.8 7.6 5.7  --   --   --   HGB 15.6 16.5 15.1 15.3 15.3 15.3  HCT 48.5 50.3 46.3 45.0 45.0 45.0  MCV 93.6 95.4 94.5  --   --   --   PLT 212 212 194  --   --   --    Basic Metabolic Panel: Recent Labs  Lab 03/19/23 1434 03/19/23 2025 03/20/23 0323 03/20/23 1545 03/20/23 1549 03/20/23 1550  NA 140  --  138 141 141 142  K 4.5  --  3.8 3.9 3.8 3.6  CL 108  --  106  --   --   --   CO2 25  --  25  --   --   --   GLUCOSE 105*  --  101*  --   --   --  BUN 18  --  18  --   --   --   CREATININE 1.34* 1.31* 1.27*  --   --   --   CALCIUM 9.6  --  9.1  --   --   --   MG  --   --  2.2  --   --   --    GFR: Estimated Creatinine Clearance: 65.5 mL/min (A) (by C-G formula based on SCr of 1.27 mg/dL (H)). Liver Function Tests: No results for input(s): "AST", "ALT", "ALKPHOS", "BILITOT", "PROT", "ALBUMIN" in the last 168 hours. No results for input(s): "LIPASE", "AMYLASE" in the last 168 hours. No results for input(s): "AMMONIA" in the last 168 hours. Coagulation Profile: No results for input(s): "INR", "PROTIME" in the last 168 hours. Cardiac Enzymes: No results for input(s): "CKTOTAL", "CKMB", "CKMBINDEX", "TROPONINI" in the last 168 hours. BNP (last 3 results) No results for input(s): "PROBNP" in the last 8760 hours. HbA1C: No  results for input(s): "HGBA1C" in the last 72 hours. CBG: No results for input(s): "GLUCAP" in the last 168 hours. Lipid Profile: Recent Labs    03/20/23 0323  CHOL 183  HDL 48  LDLCALC 116*  TRIG 93  CHOLHDL 3.8   Thyroid Function Tests: Recent Labs    03/20/23 0323  TSH 2.122   Anemia Panel: No results for input(s): "VITAMINB12", "FOLATE", "FERRITIN", "TIBC", "IRON", "RETICCTPCT" in the last 72 hours. Urine analysis:    Component Value Date/Time   COLORURINE Yellow 10/06/2013 2229   COLORURINE YELLOW 04/23/2010 0910   APPEARANCEUR Clear 10/06/2013 2229   LABSPEC 1.020 10/06/2013 2229   PHURINE 5.0 10/06/2013 2229   PHURINE 5.5 04/23/2010 0910   GLUCOSEU 50 mg/dL 40/98/1191 4782   GLUCOSEU NEGATIVE 04/23/2010 0910   HGBUR 3+ 10/06/2013 2229   HGBUR negative 06/22/2007 0925   BILIRUBINUR Negative 10/06/2013 2229   KETONESUR Trace 10/06/2013 2229   KETONESUR NEGATIVE 04/23/2010 0910   PROTEINUR Negative 10/06/2013 2229   UROBILINOGEN 2.0 05/10/2011 1109   UROBILINOGEN 1.0 04/23/2010 0910   NITRITE Negative 10/06/2013 2229   NITRITE NEGATIVE 04/23/2010 0910   LEUKOCYTESUR 1+ 10/06/2013 2229   Sepsis Labs: @LABRCNTIP (procalcitonin:4,lacticidven:4)  )No results found for this or any previous visit (from the past 240 hour(s)).       Radiology Studies: CARDIAC CATHETERIZATION  Result Date: 03/20/2023 Images from the original result were not included. Jeremy Kerr is a 72 y.o. male  [id number] LOCATION:  FACILITY: [facility] PHYSICIAN: [name], M.D. Sep 07, 1951 DATE OF PROCEDURE:  03/20/2023 DATE OF DISCHARGE: CARDIAC CATHETERIZATION History obtained from chart review.Jeremy Kerr is a 72 y.o. male with a hx of obesity, GERD, first-degree AV block, chronic sinus bradycardia who is being seen 03/20/2023 for the evaluation of chest pain at the request of Dr. Margo Aye.  His troponins were negative.  His EKG showed no acute changes.  His LV function was normal as well.  He  presents now for right left heart cath to define his anatomy and physiology. HEMODYNAMICS:  1: Right atrial pressure-4/2 2: Right ventricular pressure-26/0 3: Pulmonary artery pressure-20/4, mean 12 4: Pulmonary wedge pressure-A-wave 7, V wave 5, mean 2 5: LVEDP-6 mmHg 6: Cardiac output-5.9 L/min with an index of 2.6 L/min/m   Mr. Comans has clean coronary arteries and normal filling pressures.  I do not think his chest pain and shortness of breath are cardiovascular in etiology.  Further workup per the rounding team.  The radial sheath was removed and a TR band was  placed on the right wrist to achieve patent hemostasis.  The right antecubital sheath was removed and a pressure dressing was applied.  The patient left lab in stable condition.  Dr. Tenny Craw, the attending cardiologist, was notified of these results. [name]. MD, [facility] 03/20/2023 4:06 PM    ECHOCARDIOGRAM COMPLETE  Result Date: 03/20/2023    ECHOCARDIOGRAM REPORT   Patient Name:   Jeremy Kerr Date of Exam: 03/20/2023 Medical Rec #:  [id number]    Height:       74.0 in Accession #:    [id number]   Weight:       221.0 lb Date of Birth:  10-14-1950    BSA:          2.268 m Patient Age:    71 years     BP:           137/71 mmHg Patient Gender: M            HR:           49 bpm. Exam Location:  Inpatient Procedure: 2D Echo, Cardiac Doppler and Color Doppler Indications:    Chest Pain  History:        Patient has no prior history of Echocardiogram examinations.                 Arrythmias:Bradycardia and 1st degree AV block;                 Signs/Symptoms:Chest Pain.  Sonographer:    Wallie Char Referring Phys: [id number] CAROLE N HALL  Sonographer Comments: Technically challenging study due to limited acoustic windows. IMPRESSIONS  1. Technically difficult study with poor echo windows.  2. Left ventricular ejection fraction, by estimation, is 55 to 60%. The left ventricle has normal function. Left ventricular endocardial border not optimally defined to  evaluate regional wall motion. There is mild asymmetric left ventricular hypertrophy  of the basal-septal segment. Left ventricular diastolic parameters are consistent with Grade I diastolic dysfunction (impaired relaxation).  3. Right ventricular systolic function is normal. The right ventricular size is normal.  4. Left atrial size was mildly dilated.  5. The mitral valve is grossly normal. Trivial mitral valve regurgitation. No evidence of mitral stenosis.  6. The aortic valve was not well visualized. Aortic valve regurgitation is not visualized. Comparison(s): No prior Echocardiogram. FINDINGS  Left Ventricle: Left ventricular ejection fraction, by estimation, is 55 to 60%. The left ventricle has normal function. Left ventricular endocardial border not optimally defined to evaluate regional wall motion. The left ventricular internal cavity size was normal in size. There is mild asymmetric left ventricular hypertrophy of the basal-septal segment. Left ventricular diastolic parameters are consistent with Grade I diastolic dysfunction (impaired relaxation). Right Ventricle: The right ventricular size is normal. No increase in right ventricular wall thickness. Right ventricular systolic function is normal. Left Atrium: Left atrial size was mildly dilated. Right Atrium: Right atrial size was normal in size. Pericardium: There is no evidence of pericardial effusion. Mitral Valve: The mitral valve is grossly normal. Trivial mitral valve regurgitation. No evidence of mitral valve stenosis. MV peak gradient, 2.1 mmHg. The mean mitral valve gradient is 1.0 mmHg. Tricuspid Valve: The tricuspid valve is normal in structure. Tricuspid valve regurgitation is not demonstrated. Aortic Valve: The aortic valve was not well visualized. Aortic valve regurgitation is not visualized. Aortic valve mean gradient measures 2.5 mmHg. Aortic valve peak gradient measures 5.0 mmHg. Aortic valve area, by VTI measures 3.15 cm. Pulmonic Valve:  The pulmonic valve was not well visualized. Pulmonic valve regurgitation is trivial. Aorta: The aortic root is normal in size and structure. IAS/Shunts: The atrial septum is grossly normal.  LEFT VENTRICLE PLAX 2D LVIDd:         4.40 cm      Diastology LVIDs:         3.20 cm      LV e' medial:    6.50 cm/s LV PW:         1.20 cm      LV E/e' medial:  8.0 LV IVS:        1.30 cm      LV e' lateral:   9.21 cm/s LVOT diam:     2.10 cm      LV E/e' lateral: 5.7 LV SV:         77 LV SV Index:   34 LVOT Area:     3.46 cm  LV Volumes (MOD) LV vol d, MOD A2C: 44.1 ml LV vol d, MOD A4C: 102.0 ml LV vol s, MOD A2C: 21.5 ml LV vol s, MOD A4C: 48.7 ml LV SV MOD A2C:     22.6 ml LV SV MOD A4C:     102.0 ml LV SV MOD BP:      35.2 ml RIGHT VENTRICLE RV Basal diam:  3.10 cm RV S prime:     11.60 cm/s TAPSE (M-mode): 2.2 cm LEFT ATRIUM             Index        RIGHT ATRIUM           Index LA diam:        4.50 cm 1.98 cm/m   RA Area:     13.00 cm LA Vol (A2C):   52.3 ml 23.06 ml/m  RA Volume:   26.90 ml  11.86 ml/m LA Vol (A4C):   76.2 ml 33.60 ml/m LA Biplane Vol: 64.9 ml 28.62 ml/m  AORTIC VALVE AV Area (Vmax):    3.26 cm AV Area (Vmean):   3.07 cm AV Area (VTI):     3.15 cm AV Vmax:           111.50 cm/s AV Vmean:          77.250 cm/s AV VTI:            0.244 m AV Peak Grad:      5.0 mmHg AV Mean Grad:      2.5 mmHg LVOT Vmax:         104.95 cm/s LVOT Vmean:        68.450 cm/s LVOT VTI:          0.222 m LVOT/AV VTI ratio: 0.91  AORTA Ao Root diam: 3.40 cm Ao Asc diam:  3.30 cm MITRAL VALVE MV Area (PHT): 2.45 cm    SHUNTS MV Area VTI:   3.27 cm    Systemic VTI:  0.22 m MV Peak grad:  2.1 mmHg    Systemic Diam: 2.10 cm MV Mean grad:  1.0 mmHg MV Vmax:       0.73 m/s MV Vmean:      39.0 cm/s MV Decel Time: 310 msec MV E velocity: 52.10 cm/s MV A velocity: 63.40 cm/s MV E/A ratio:  0.82 Laurance Flatten MD Electronically signed by Laurance Flatten MD Signature Date/Time: 03/20/2023/12:57:58 PM    Final    DG Chest 2  View  Result Date: 03/19/2023 CLINICAL DATA:  Chest pain EXAM:  CHEST - 2 VIEW COMPARISON:  X-ray 02/21/2023 FINDINGS: No consolidation, pneumothorax or effusion. No edema. Normal cardiopericardial silhouette. Degenerative changes are seen along the spine. IMPRESSION: No acute cardiopulmonary disease. Electronically Signed   By: Karen Kays M.D.   On: 03/19/2023 15:05        Scheduled Meds:  nitroGLYCERIN       pantoprazole  40 mg Oral Daily   sodium chloride flush  3 mL Intravenous Q12H   sodium chloride flush  3 mL Intravenous Q12H   Continuous Infusions:  sodium chloride 50 mL/hr at 03/20/23 0600   sodium chloride 75 mL/hr at 03/20/23 1625   sodium chloride       LOS: 0 days    Time spent: 35 minutes.    Berton Mount, MD  [facility] Pager #: [phone] 7PM-7AM contact night coverage as above

## 2023-03-20 NOTE — TOC CM/SW Note (Signed)
Transition of Care [facility]) - Inpatient Brief Assessment   Patient Details  Name: Jeremy Kerr MRN: [id number] Date of Birth: [birth year]  [facility]) CM/SW Contact:    Ronny Bacon, RN Phone Number: 03/20/2023, 3:43 PM   Clinical Narrative:  Patient out of room for procedure, unable to assess needs. [facility] fees coordinator notified of patient admission via VM.   Transition of Care Asessment:

## 2023-03-21 ENCOUNTER — Encounter (HOSPITAL_COMMUNITY): Payer: Self-pay | Admitting: Cardiovascular Disease

## 2023-03-21 DIAGNOSIS — R0600 Dyspnea, unspecified: Secondary | ICD-10-CM

## 2023-03-21 DIAGNOSIS — R072 Precordial pain: Secondary | ICD-10-CM | POA: Diagnosis not present

## 2023-03-21 LAB — CBC
HCT: 45 % (ref 39.0–52.0)
Hemoglobin: 15 g/dL (ref 13.0–17.0)
MCH: 30.6 pg (ref 26.0–34.0)
MCHC: 33.3 g/dL (ref 30.0–36.0)
MCV: 91.8 fL (ref 80.0–100.0)
Platelets: 197 10*3/uL (ref 150–400)
RBC: 4.9 MIL/uL (ref 4.22–5.81)
RDW: 12.4 % (ref 11.5–15.5)
WBC: 5.4 10*3/uL (ref 4.0–10.5)
nRBC: 0 % (ref 0.0–0.2)

## 2023-03-21 LAB — RENAL FUNCTION PANEL
Albumin: 3.5 g/dL (ref 3.5–5.0)
Anion gap: 9 (ref 5–15)
BUN: 20 mg/dL (ref 8–23)
CO2: 25 mmol/L (ref 22–32)
Calcium: 9.3 mg/dL (ref 8.9–10.3)
Chloride: 107 mmol/L (ref 98–111)
Creatinine, Ser: 1.3 mg/dL — ABNORMAL HIGH (ref 0.61–1.24)
GFR, Estimated: 58 mL/min — ABNORMAL LOW (ref >60)
Glucose, Bld: 82 mg/dL (ref 70–99)
Phosphorus: 2.6 mg/dL (ref 2.5–4.6)
Potassium: 3.8 mmol/L (ref 3.5–5.1)
Sodium: 141 mmol/L (ref 135–145)

## 2023-03-21 NOTE — Plan of Care (Signed)
Pt A&OX4. D/C AVS reviewed. IV and tele removed. All follow up questions answered. No s/s of bleeding. All personal items returned. BP 107/70 (BP Location: Left Arm)   Pulse (!) 54   Temp 98 F (36.7 C) (Oral)   Resp 16   Ht 6\' 2"  (1.88 m)   Wt 100.2 kg   SpO2 95%   BMI 28.37 kg/m  Escorted off the floor by staff to home. Reva Bores 03/21/23 Reva Bores 3:51 PM

## 2023-03-21 NOTE — TOC CM/SW Note (Signed)
Transition of Care [facility]) - Inpatient Brief Assessment   Patient Details  Name: Jeremy Kerr MRN: [id number] Date of Birth: [birth year]  Transition of Care [facility]) CM/SW Contact:    Gala Lewandowsky, RN Phone Number: 03/21/2023, 2:40 PM   Clinical Narrative: Patient presented for chest pain. PTA patient was independent from home with spouse. No home needs identified at this time.   Transition of Care Asessment: Insurance and Status: Insurance coverage has been reviewed Patient has primary care physician: Yes Home environment has been reviewed: reviewed. Prior level of function:: independent Prior/Current Home Services: No current home services Social Determinants of Health Reivew: SDOH reviewed no interventions necessary Readmission risk has been reviewed: Yes Transition of care needs: no transition of care needs at this time

## 2023-03-21 NOTE — Progress Notes (Signed)
Rounding Note    Patient Name: Jeremy Kerr Date of Encounter: 03/21/2023    Subjective   Patient feeling better   No CP  NO shoulder pain  Breathing is OK  Inpatient Medications    Scheduled Meds:  pantoprazole  40 mg Oral Daily   sodium chloride flush  3 mL Intravenous Q12H   sodium chloride flush  3 mL Intravenous Q12H   Continuous Infusions:  sodium chloride     PRN Meds: sodium chloride, acetaminophen, morphine injection, nitroGLYCERIN, ondansetron (ZOFRAN) IV, sodium chloride flush   Vital Signs    Vitals:   03/20/23 1610 03/20/23 1959 03/20/23 2348 03/21/23 0403  BP: 119/81 124/63 119/71 117/79  Pulse: (!) 48 60 (!) 59 (!) 56  Resp: 14 15 18 18   Temp: 98.1 F (36.7 C) 98.1 F (36.7 C) 97.8 F (36.6 C) 97.9 F (36.6 C)  TempSrc: Oral Oral Oral Oral  SpO2: 99% 96% 99% 95%  Weight:      Height:        Intake/Output Summary (Last 24 hours) at 03/21/2023 0749 Last data filed at 03/21/2023 0404 Gross per 24 hour  Intake 507.4 ml  Output --  Net 507.4 ml      03/19/2023   10:00 PM 02/21/2023   11:11 PM 05/09/2019    4:41 AM  Last 3 Weights  Weight (lbs) 221 lb 214 lb 210 lb  Weight (kg) 100.245 kg 97.07 kg 95.255 kg      Telemetry    SR  - Personally Reviewed  ECG    No new  - Personally Reviewed  Physical Exam   GEN: No acute distress.   Neck: JVP is normal  Cardiac: RRR, no murmur   Respiratory: Clear to auscultation  GI: Soft, nontender MS: No edema; Labs    High Sensitivity Troponin:   Recent Labs  Lab 02/21/23 2312 02/22/23 0114 03/19/23 1434 03/19/23 1700  TROPONINIHS 10 13 9 8      Chemistry Recent Labs  Lab 03/19/23 1434 03/19/23 2025 03/20/23 0323 03/20/23 1545 03/20/23 1549 03/20/23 1550  NA 140  --  138 141 141 142  K 4.5  --  3.8 3.9 3.8 3.6  CL 108  --  106  --   --   --   CO2 25  --  25  --   --   --   GLUCOSE 105*  --  101*  --   --   --   BUN 18  --  18  --   --   --   CREATININE 1.34* 1.31* 1.27*  --    --   --   CALCIUM 9.6  --  9.1  --   --   --   MG  --   --  2.2  --   --   --   GFRNONAA 56* 57* 60*  --   --   --   ANIONGAP 7  --  7  --   --   --     Lipids  Recent Labs  Lab 03/20/23 0323  CHOL 183  TRIG 93  HDL 48  LDLCALC 116*  CHOLHDL 3.8    Hematology Recent Labs  Lab 03/19/23 1434 03/19/23 2025 03/20/23 0323 03/20/23 1545 03/20/23 1549 03/20/23 1550  WBC 6.8 7.6 5.7  --   --   --   RBC 5.18 5.27 4.90  --   --   --   HGB 15.6 16.5 15.1  15.3 15.3 15.3  HCT 48.5 50.3 46.3 45.0 45.0 45.0  MCV 93.6 95.4 94.5  --   --   --   MCH 30.1 31.3 30.8  --   --   --   MCHC 32.2 32.8 32.6  --   --   --   RDW 12.5 12.6 12.5  --   --   --   PLT 212 212 194  --   --   --    Thyroid  Recent Labs  Lab 03/20/23 0323  TSH 2.122    BNPNo results for input(s): "BNP", "PROBNP" in the last 168 hours.  DDimer No results for input(s): "DDIMER" in the last 168 hours.   Radiology    CARDIAC CATHETERIZATION  Result Date: 03/20/2023 Images from the original result were not included. Jeremy Kerr is a 72 y.o. male  [id number] LOCATION:  FACILITY: [facility] PHYSICIAN: [name], M.D. 10-Jan-1951 DATE OF PROCEDURE:  03/20/2023 DATE OF DISCHARGE: CARDIAC CATHETERIZATION History obtained from chart review.Jeremy Kerr is a 72 y.o. male with a hx of obesity, GERD, first-degree AV block, chronic sinus bradycardia who is being seen 03/20/2023 for the evaluation of chest pain at the request of Dr. Margo Aye.  His troponins were negative.  His EKG showed no acute changes.  His LV function was normal as well.  He presents now for right left heart cath to define his anatomy and physiology. HEMODYNAMICS:  1: Right atrial pressure-4/2 2: Right ventricular pressure-26/0 3: Pulmonary artery pressure-20/4, mean 12 4: Pulmonary wedge pressure-A-wave 7, V wave 5, mean 2 5: LVEDP-6 mmHg 6: Cardiac output-5.9 L/min with an index of 2.6 L/min/m   Jeremy Kerr has clean coronary arteries and normal filling pressures.  I do not  think his chest pain and shortness of breath are cardiovascular in etiology.  Further workup per the rounding team.  The radial sheath was removed and a TR band was placed on the right wrist to achieve patent hemostasis.  The right antecubital sheath was removed and a pressure dressing was applied.  The patient left lab in stable condition.  Dr. Tenny Craw, the attending cardiologist, was notified of these results. [name]. MD, [facility] 03/20/2023 4:06 PM    ECHOCARDIOGRAM COMPLETE  Result Date: 03/20/2023    ECHOCARDIOGRAM REPORT   Patient Name:   Jeremy Kerr Date of Exam: 03/20/2023 Medical Rec #:  [id number]    Height:       74.0 in Accession #:    [id number]   Weight:       221.0 lb Date of Birth:  [birth year]    BSA:          2.268 m Patient Age:    71 years     BP:           137/71 mmHg Patient Gender: M            HR:           49 bpm. Exam Location:  Inpatient Procedure: 2D Echo, Cardiac Doppler and Color Doppler Indications:    Chest Pain  History:        Patient has no prior history of Echocardiogram examinations.                 Arrythmias:Bradycardia and 1st degree AV block;                 Signs/Symptoms:Chest Pain.  Sonographer:    Wallie Char Referring Phys: [id number] Darlin Drop  Sonographer Comments: Technically challenging study due to limited acoustic windows. IMPRESSIONS  1. Technically difficult study with poor echo windows.  2. Left ventricular ejection fraction, by estimation, is 55 to 60%. The left ventricle has normal function. Left ventricular endocardial border not optimally defined to evaluate regional wall motion. There is mild asymmetric left ventricular hypertrophy  of the basal-septal segment. Left ventricular diastolic parameters are consistent with Grade I diastolic dysfunction (impaired relaxation).  3. Right ventricular systolic function is normal. The right ventricular size is normal.  4. Left atrial size was mildly dilated.  5. The mitral valve is grossly normal. Trivial mitral  valve regurgitation. No evidence of mitral stenosis.  6. The aortic valve was not well visualized. Aortic valve regurgitation is not visualized. Comparison(s): No prior Echocardiogram. FINDINGS  Left Ventricle: Left ventricular ejection fraction, by estimation, is 55 to 60%. The left ventricle has normal function. Left ventricular endocardial border not optimally defined to evaluate regional wall motion. The left ventricular internal cavity size was normal in size. There is mild asymmetric left ventricular hypertrophy of the basal-septal segment. Left ventricular diastolic parameters are consistent with Grade I diastolic dysfunction (impaired relaxation). Right Ventricle: The right ventricular size is normal. No increase in right ventricular wall thickness. Right ventricular systolic function is normal. Left Atrium: Left atrial size was mildly dilated. Right Atrium: Right atrial size was normal in size. Pericardium: There is no evidence of pericardial effusion. Mitral Valve: The mitral valve is grossly normal. Trivial mitral valve regurgitation. No evidence of mitral valve stenosis. MV peak gradient, 2.1 mmHg. The mean mitral valve gradient is 1.0 mmHg. Tricuspid Valve: The tricuspid valve is normal in structure. Tricuspid valve regurgitation is not demonstrated. Aortic Valve: The aortic valve was not well visualized. Aortic valve regurgitation is not visualized. Aortic valve mean gradient measures 2.5 mmHg. Aortic valve peak gradient measures 5.0 mmHg. Aortic valve area, by VTI measures 3.15 cm. Pulmonic Valve: The pulmonic valve was not well visualized. Pulmonic valve regurgitation is trivial. Aorta: The aortic root is normal in size and structure. IAS/Shunts: The atrial septum is grossly normal.  LEFT VENTRICLE PLAX 2D LVIDd:         4.40 cm      Diastology LVIDs:         3.20 cm      LV e' medial:    6.50 cm/s LV PW:         1.20 cm      LV E/e' medial:  8.0 LV IVS:        1.30 cm      LV e' lateral:   9.21  cm/s LVOT diam:     2.10 cm      LV E/e' lateral: 5.7 LV SV:         77 LV SV Index:   34 LVOT Area:     3.46 cm  LV Volumes (MOD) LV vol d, MOD A2C: 44.1 ml LV vol d, MOD A4C: 102.0 ml LV vol s, MOD A2C: 21.5 ml LV vol s, MOD A4C: 48.7 ml LV SV MOD A2C:     22.6 ml LV SV MOD A4C:     102.0 ml LV SV MOD BP:      35.2 ml RIGHT VENTRICLE RV Basal diam:  3.10 cm RV S prime:     11.60 cm/s TAPSE (M-mode): 2.2 cm LEFT ATRIUM             Index  RIGHT ATRIUM           Index LA diam:        4.50 cm 1.98 cm/m   RA Area:     13.00 cm LA Vol (A2C):   52.3 ml 23.06 ml/m  RA Volume:   26.90 ml  11.86 ml/m LA Vol (A4C):   76.2 ml 33.60 ml/m LA Biplane Vol: 64.9 ml 28.62 ml/m  AORTIC VALVE AV Area (Vmax):    3.26 cm AV Area (Vmean):   3.07 cm AV Area (VTI):     3.15 cm AV Vmax:           111.50 cm/s AV Vmean:          77.250 cm/s AV VTI:            0.244 m AV Peak Grad:      5.0 mmHg AV Mean Grad:      2.5 mmHg LVOT Vmax:         104.95 cm/s LVOT Vmean:        68.450 cm/s LVOT VTI:          0.222 m LVOT/AV VTI ratio: 0.91  AORTA Ao Root diam: 3.40 cm Ao Asc diam:  3.30 cm MITRAL VALVE MV Area (PHT): 2.45 cm    SHUNTS MV Area VTI:   3.27 cm    Systemic VTI:  0.22 m MV Peak grad:  2.1 mmHg    Systemic Diam: 2.10 cm MV Mean grad:  1.0 mmHg MV Vmax:       0.73 m/s MV Vmean:      39.0 cm/s MV Decel Time: 310 msec MV E velocity: 52.10 cm/s MV A velocity: 63.40 cm/s MV E/A ratio:  0.82 Laurance Flatten MD Electronically signed by Laurance Flatten MD Signature Date/Time: 03/20/2023/12:57:58 PM    Final    DG Chest 2 View  Result Date: 03/19/2023 CLINICAL DATA:  Chest pain EXAM: CHEST - 2 VIEW COMPARISON:  X-ray 02/21/2023 FINDINGS: No consolidation, pneumothorax or effusion. No edema. Normal cardiopericardial silhouette. Degenerative changes are seen along the spine. IMPRESSION: No acute cardiopulmonary disease. Electronically Signed   By: Karen Kays M.D.   On: 03/19/2023 15:05    Cardiac Studies   R/L heart  catheterization   March 20 2023 HEMODYNAMICS:    1: Right atrial pressure-4/2 2: Right ventricular pressure-26/0 3: Pulmonary artery pressure-20/4, mean 12 4: Pulmonary wedge pressure-A-wave 7, V wave 5, mean 2 5: LVEDP-6 mmHg 6: Cardiac output-5.9 L/min with an index of 2.6 L/min/m   IMPRESSION: Jeremy Kerr has clean coronary arteries and normal filling pressures.  I do not think his chest pain and shortness of breath are cardiovascular in etiology.  Further workup per the rounding team.    Patient Profile     Jeremy Kerr is a 72 y.o. male with a hx of obesity, GERD, first-degree AV block, chronic sinus bradycardia  who is being seen 03/20/2023 for the evaluation of chest pain at the request of Dr. Margo Aye   Assessment & Plan    1  Chest pain  Pt has had several episodes of chest / arm pain over the past few weeks   was seen in ER on 5/15    Declined admission.   Also seen at [facility]   Stress test ordered    He does not want to do   Says he could have a heart attack. Now admitted with episode of severe CP/ arm pain with minimal exertion. Note  that patient and wife say that he gets more SOB with activity over the past several weeks    R/L heart cath yesterday as noted   Normal  I do not think he has a cardiac source for symptoms   ? If he could have gallbladder problems which could mimic symtpoms (SOB, shoulder pain/arm pain)    2 Bradycardia    No significant bradycardia BP is OK  3  LIpids   LDL 116  HDL 48    Trig 93  Total chol 183   No new recommendations  OK to d/c from cardiac standpoint     WIll sign off   Please call with questions  For questions or updates, please contact [facility] Please consult [email or web address] for contact info under        Signed, Dietrich Pates, MD  03/21/2023, 7:49 AM

## 2023-03-21 NOTE — Discharge Summary (Signed)
Physician Discharge Summary  Patient ID: Jeremy Kerr MRN: [id number] DOB/AGE: 1951/04/10 72 y.o.  Admit date: 03/19/2023 Discharge date: 03/21/2023  Admission Diagnoses:  Discharge Diagnoses:  Principal Problem:   Chest pain Active Problems:   Unstable angina (HCC)   Dyspnea   Discharged Condition: stable  Hospital Course:  Patient is a 72 year old male with past medical history significant for obesity, GERD, first-degree AV block and chronic sinus bradycardia. Patient was admitted with chest pain.  Apparently, chest pain has been going on for about 6 to 8 weeks.  Prior to presentation: Chest pain has become severe, with radiation to left upper extremity, and associated shortness of breath and diaphoresis. Troponin was negative. Cardiology team was consulted to assist with patient's management.  Patient underwent right and left cardiac catheterization that came back normal.  Cardiology team has cleared patient for discharge.  Patient will follow-up primary care provider and cardiology team on discharge.    Chest pain, rule out ACS -Patient reported sharp chest pain, intermittent, and had been going on for the last 6 to 8 weeks. -Troponin is negative x 2. -EKG reveals poor R wave progression. -Cardiology team directed care. -Patient underwent right and left cardiac catheterization that came back normal. -Patient has been cleared by the cardiology team for discharge. -Follow-up with primary care provider and cardiology team on discharge.     Chronic sinus bradycardia Asymptomatic-not on any AV nodal blockade agents. TSH 2.122.  Normal. Monitored on telemetry   Chronic kidney disease stage II/IIIa: -Stable. -Monitored closely for contrast associated nephropathy after cardiac catheterization. -Gentle hydration.   GERD Resume home PPI   Overweight Weight 97 kg with BMI of 28.      Consults: cardiology    Discharge Exam: Blood pressure 107/70, pulse (!) 54, temperature 98  F (36.7 C), temperature source Oral, resp. rate 16, height 6\' 2"  (1.88 m), weight 100.2 kg, SpO2 95 %.   Disposition: Discharge disposition: 01-Home or Self Care       Discharge Instructions     Diet - low sodium heart healthy   Complete by: As directed    Increase activity slowly   Complete by: As directed       Allergies as of 03/21/2023   No Known Allergies      Medication List     TAKE these medications    RABEprazole 20 MG tablet Commonly known as: Aciphex Take 1 tablet (20 mg total) by mouth daily.        Follow-up Information     Pricilla Riffle, MD Follow up on 04/04/2023.   Specialty: Cardiology Why: with her NP Eligha Bridegroom  at 10:55 AM  please arrive 15 min early to check in Contact information: [address] [place] [address] [phone]                 Signed: Barnetta Chapel 03/21/2023, 3:12 PM

## 2023-03-22 LAB — LIPOPROTEIN A (LPA): Lipoprotein (a): 21.6 nmol/L (ref <75.0)

## 2023-03-30 ENCOUNTER — Ambulatory Visit: Payer: Medicare PPO | Admitting: Cardiology

## 2023-04-04 ENCOUNTER — Ambulatory Visit: Payer: Medicare PPO | Admitting: Nurse Practitioner

## 2023-08-11 ENCOUNTER — Ambulatory Visit: Payer: No Typology Code available for payment source | Admitting: Nurse Practitioner
